# Patient Record
Sex: Male | Born: 1990 | Race: White | Hispanic: No | Marital: Single | Smoking: Current some day smoker
Health system: Southern US, Community
[De-identification: ages and names within clinical notes are randomized; demographics above are authoritative.]

---
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
## 2015-05-17 ENCOUNTER — Emergency Department (HOSPITAL_COMMUNITY): Payer: Self-pay

## 2015-05-17 ENCOUNTER — Encounter (HOSPITAL_COMMUNITY): Payer: Self-pay | Admitting: Emergency Medicine

## 2015-05-17 ENCOUNTER — Emergency Department (HOSPITAL_COMMUNITY)
Admission: EM | Admit: 2015-05-17 | Discharge: 2015-05-17 | Disposition: A | Discharge status: Home / Self Care | Payer: Self-pay | Attending: Emergency Medicine | Admitting: Emergency Medicine

## 2015-05-17 DIAGNOSIS — N200 Calculus of kidney: Secondary | ICD-10-CM | POA: Insufficient documentation

## 2015-05-17 DIAGNOSIS — Z720 Tobacco use: Secondary | ICD-10-CM | POA: Insufficient documentation

## 2015-05-17 LAB — URINALYSIS, ROUTINE W REFLEX MICROSCOPIC
Bilirubin Urine: NEGATIVE
Glucose, UA: NEGATIVE mg/dL
Ketones, ur: NEGATIVE mg/dL
Nitrite: NEGATIVE
Protein, ur: NEGATIVE mg/dL
Specific Gravity, Urine: 1.018 (ref 1.005–1.030)
Urobilinogen, UA: 0.2 mg/dL (ref 0.0–1.0)
pH: 6 (ref 5.0–8.0)

## 2015-05-17 LAB — CBC
HCT: 46.4 % (ref 39.0–52.0)
Hemoglobin: 15.8 g/dL (ref 13.0–17.0)
MCH: 31.4 pg (ref 26.0–34.0)
MCHC: 34.1 g/dL (ref 30.0–36.0)
MCV: 92.2 fL (ref 78.0–100.0)
Platelets: 228 K/uL (ref 150–400)
RBC: 5.03 MIL/uL (ref 4.22–5.81)
RDW: 13 % (ref 11.5–15.5)
WBC: 9.9 K/uL (ref 4.0–10.5)

## 2015-05-17 LAB — COMPREHENSIVE METABOLIC PANEL WITH GFR
ALT: 14 U/L — ABNORMAL LOW (ref 17–63)
AST: 16 U/L (ref 15–41)
Albumin: 4.3 g/dL (ref 3.5–5.0)
Alkaline Phosphatase: 45 U/L (ref 38–126)
Anion gap: 8 (ref 5–15)
BUN: 9 mg/dL (ref 6–20)
CO2: 25 mmol/L (ref 22–32)
Calcium: 9.4 mg/dL (ref 8.9–10.3)
Chloride: 107 mmol/L (ref 101–111)
Creatinine, Ser: 0.97 mg/dL (ref 0.61–1.24)
GFR calc Af Amer: >60 mL/min
GFR calc non Af Amer: >60 mL/min
Glucose, Bld: 105 mg/dL — ABNORMAL HIGH (ref 65–99)
Potassium: 4 mmol/L (ref 3.5–5.1)
Sodium: 140 mmol/L (ref 135–145)
Total Bilirubin: 0.6 mg/dL (ref 0.3–1.2)
Total Protein: 6.9 g/dL (ref 6.5–8.1)

## 2015-05-17 LAB — LIPASE, BLOOD: LIPASE: 22 U/L (ref 22–51)

## 2015-05-17 LAB — URINE MICROSCOPIC-ADD ON

## 2015-05-17 LAB — ETHANOL: Alcohol, Ethyl (B): <5 mg/dL

## 2015-05-17 MED ORDER — HYDROCODONE-ACETAMINOPHEN 5-325 MG PO TABS: 1.0000 | ORAL_TABLET | ORAL | Status: DC | PRN

## 2015-05-17 MED ORDER — TAMSULOSIN HCL 0.4 MG PO CAPS: 0.4000 mg | ORAL_CAPSULE | Freq: Every day | ORAL | Status: DC

## 2015-05-17 MED ORDER — IBUPROFEN 800 MG PO TABS: 800.0000 mg | ORAL_TABLET | Freq: Three times a day (TID) | ORAL | Status: DC

## 2015-05-17 NOTE — ED Notes (Signed)
Patient here with right flank pain radiating to right abdomen. States sudden onset @ 7. States several days of intermittent symptoms. States nausea and diaphoresis this AM when pain came on suddenly.

## 2015-05-17 NOTE — ED Provider Notes (Signed)
CSN: [id number]     Arrival date & time 05/17/15  0454 History   First MD Initiated Contact with Patient 05/17/15 0720     Chief Complaint  Patient presents with  . Flank Pain     (Consider location/radiation/quality/duration/timing/severity/associated sxs/prior Treatment) HPI Patient had an episode of pain approximately 9 days ago that was in the right flank. At that time it was aching in quality. He took some ibuprofen and it improved. He reports he was doing much better and then yesterday evening the pain suddenly got worse again. It woke him from sleep and was a hard aching pain in his right flank. He denies pain burning or urgency urination. He has not noticed blood in the urine. No fever no vomiting no diarrhea. At this time the pain has improved. History reviewed. No pertinent past medical history. History reviewed. No pertinent past surgical history. History reviewed. No pertinent family history. History  Substance Use Topics  . Smoking status: Current Some Day Smoker  . Smokeless tobacco: Not on file  . Alcohol Use: Yes    Review of Systems  10 Systems reviewed and are negative for acute change except as noted in the HPI.   Allergies  Review of patient's allergies indicates no known allergies.  Home Medications   Prior to Admission medications   Medication Sig Start Date End Date Taking? Authorizing Provider  acetaZOLAMIDE (DIAMOX) 250 MG tablet Take 250 mg by mouth at bedtime.   Yes Historical Provider, MD  HYDROcodone-acetaminophen (NORCO/VICODIN) 5-325 MG per tablet Take 1-2 tablets by mouth every 4 (four) hours as needed for moderate pain or severe pain. 05/17/15   Arby Barrette, MD  ibuprofen (ADVIL,MOTRIN) 800 MG tablet Take 1 tablet (800 mg total) by mouth 3 (three) times daily. 05/17/15   Arby Barrette, MD  tamsulosin (FLOMAX) 0.4 MG CAPS capsule Take 1 capsule (0.4 mg total) by mouth daily. 05/17/15   Arby Barrette, MD   BP 101/61 mmHg  Pulse 44  Temp(Src)  97.5 F (36.4 C) (Oral)  Resp 16  Ht 5\' 8"  (1.727 m)  Wt 170 lb (77.111 kg)  BMI 25.85 kg/m2  SpO2 97% Physical Exam  Constitutional: He is oriented to person, place, and time. He appears well-developed and well-nourished.  HENT:  Head: Normocephalic and atraumatic.  Eyes: EOM are normal. Pupils are equal, round, and reactive to light.  Neck: Neck supple.  Cardiovascular: Normal rate, regular rhythm, normal heart sounds and intact distal pulses.   Pulmonary/Chest: Effort normal and breath sounds normal.  Abdominal: Soft. Bowel sounds are normal. He exhibits no distension. There is no tenderness.  Musculoskeletal: Normal range of motion. He exhibits no edema.  Neurological: He is alert and oriented to person, place, and time. He has normal strength. Coordination normal. GCS eye subscore is 4. GCS verbal subscore is 5. GCS motor subscore is 6.  Skin: Skin is warm, dry and intact.  Psychiatric: He has a normal mood and affect.    ED Course  Procedures (including critical care time) Labs Review Labs Reviewed  COMPREHENSIVE METABOLIC PANEL - Abnormal; Notable for the following:    Glucose, Bld 105 (*)    ALT 14 (*)    All other components within normal limits  URINALYSIS, ROUTINE W REFLEX MICROSCOPIC (NOT AT [facility]) - Abnormal; Notable for the following:    Color, Urine AMBER (*)    APPearance CLOUDY (*)    Hgb urine dipstick LARGE (*)    Leukocytes, UA TRACE (*)  All other components within normal limits  URINE CULTURE  LIPASE, BLOOD  CBC  URINE MICROSCOPIC-ADD ON  ETHANOL    Imaging Review Ct Renal Stone Study  05/17/2015   CLINICAL DATA:  RIGHT flank pain.  Urolithiasis.  Initial encounter.  EXAM: CT ABDOMEN AND PELVIS WITHOUT CONTRAST  TECHNIQUE: Multidetector CT imaging of the abdomen and pelvis was performed following the standard protocol without IV contrast.  COMPARISON:  None.  FINDINGS: Musculoskeletal:  No aggressive osseous lesions.  Lung Bases: Clear.  Liver:  Unenhanced CT was performed per clinician order. Lack of IV contrast limits sensitivity and specificity, especially for evaluation of abdominal/pelvic solid viscera. Normal.  Spleen:  Normal.  Gallbladder:  Contracted.  Common bile duct:  Normal.  Pancreas:  Normal.  Adrenal glands:  Normal bilaterally.  Kidneys: Nonobstructing LEFT inferior pole renal collecting system calculi are present. The largest stone measures 6 mm long axis. Nonobstructing RIGHT inferior pole collecting system punctate calculi.  The LEFT ureter is normal. The RIGHT ureter demonstrates a stone at the UPJ that measures 4 mm x 2 mm on axial imaging (image 36 series 2). The distal RIGHT ureter appears normal.  Stomach:  Normal.  Collapsed.  Small bowel:  Normal.  Colon:   Normal.  Pelvic Genitourinary:  Normal.  Peritoneum: No free fluid or free air.  Vascular/lymphatic: Grossly normal.  Body Wall: Normal.  IMPRESSION: 1. 4 mm x 2 mm RIGHT UPJ calculus.  No hydronephrosis. 2. Residual nonobstructing renal collecting system calculi.   Electronically Signed   By: Andreas Newport M.D.   On: 05/17/2015 08:08     EKG Interpretation None     Consult: Patient's case reviewed with Dr. Jacquelyne Balint of urology. He will see him in follow-up in the office. He recommends for Flomax urine strainer and pain medication. No antibiotics at this time based on UA results. MDM   Final diagnoses:  Kidney stone   Patient presents as outlined above with episodic right flank pain. Kidney stone is confirmed on CT. At this time the patient's pain has resolved. Follow-up plan is in place and instructions include return precautions.    Arby Barrette, MD 05/17/15 [phone]

## 2015-05-17 NOTE — Discharge Instructions (Signed)

## 2015-05-18 LAB — URINE CULTURE: Culture: NO GROWTH

## 2016-08-09 IMAGING — CT CT RENAL STONE PROTOCOL
2 of 4 series · 16 of 46 positions shown, 18 images · non-contrast
Comparison: None.

CLINICAL DATA: RIGHT flank pain.  Urolithiasis.  Initial encounter.

EXAM:
CT ABDOMEN AND PELVIS WITHOUT CONTRAST
TECHNIQUE: Multidetector CT imaging of the abdomen and pelvis was performed
following the standard protocol without IV contrast.

[Series 2: stone study 5.0 i30f 1 · axial · 0.83mm/px · z∈[+742,+1162]mm · 13 of 92 slices shown, 15 images]
[im 4/92  soft-tissue]
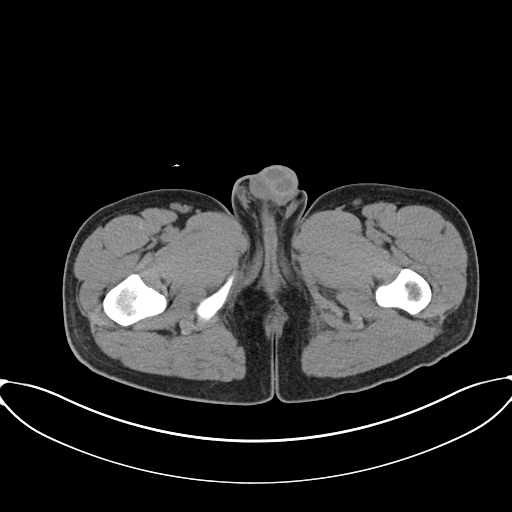
[im 4/92  bone]
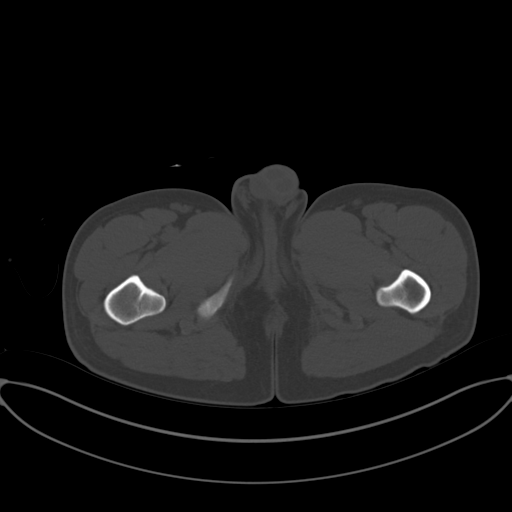
[im 11/92  soft-tissue]
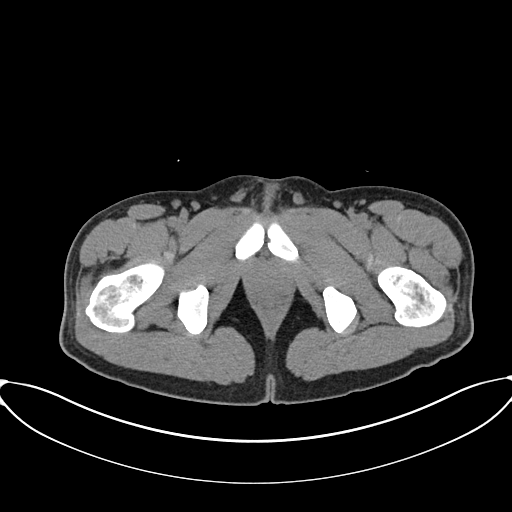
[im 19/92  soft-tissue]
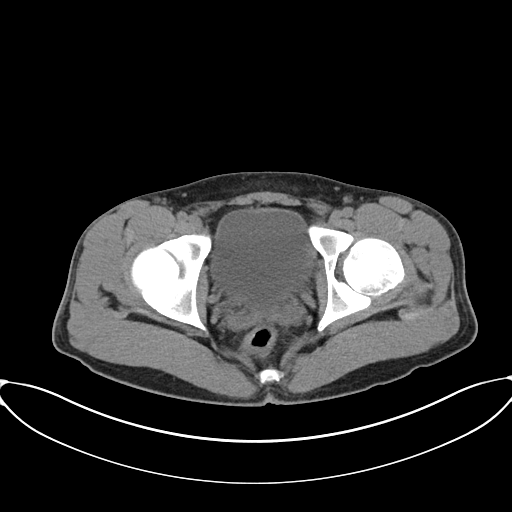
[im 26/92  soft-tissue]
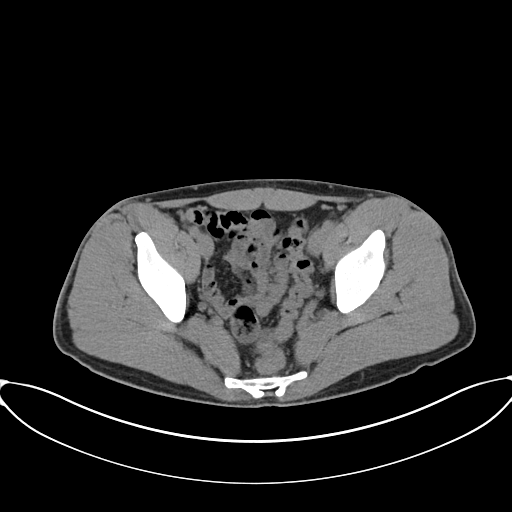
[im 33/92  soft-tissue]
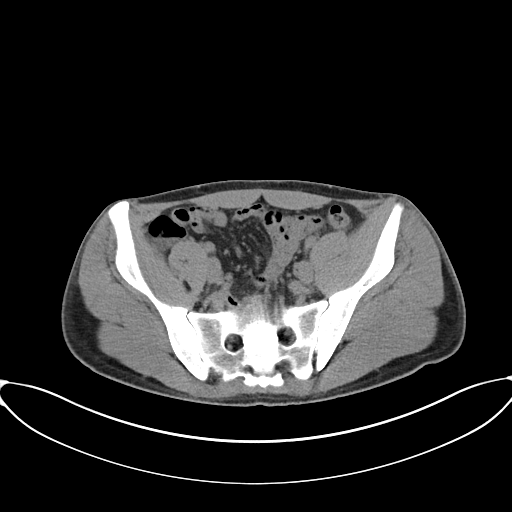
[im 41/92  soft-tissue]
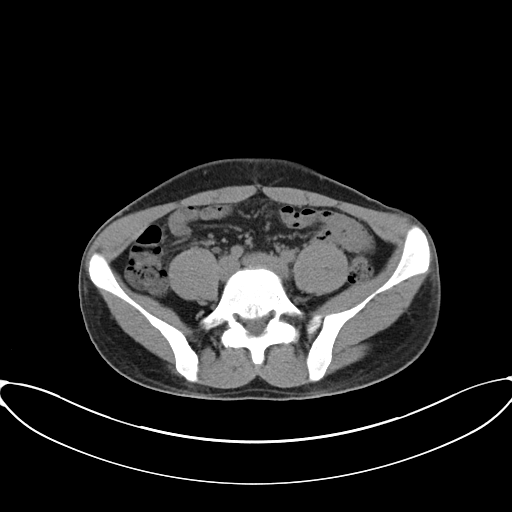
[im 48/92  soft-tissue]
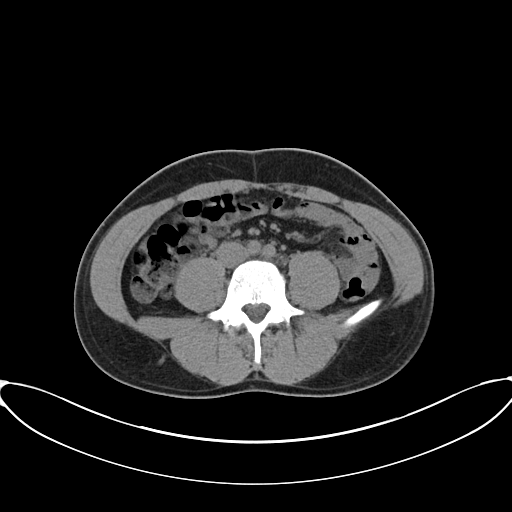
[im 51/92  soft-tissue]
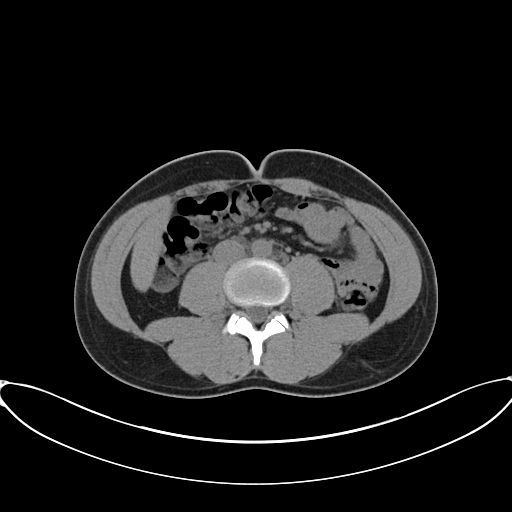
[im 59/92  soft-tissue]
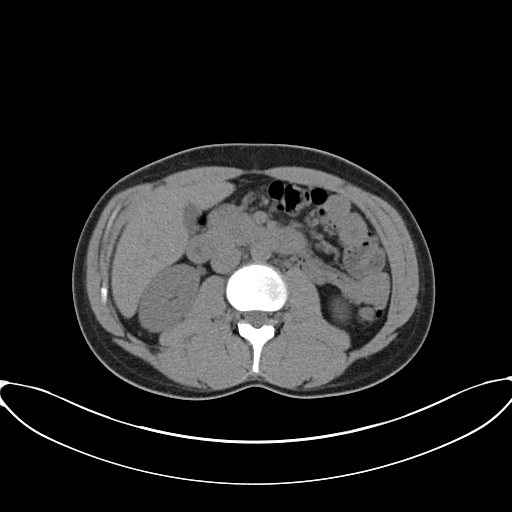
[im 59/92  bone]
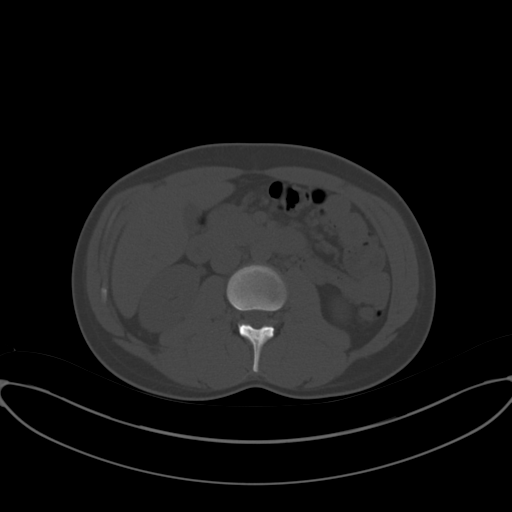
[im 66/92  soft-tissue]
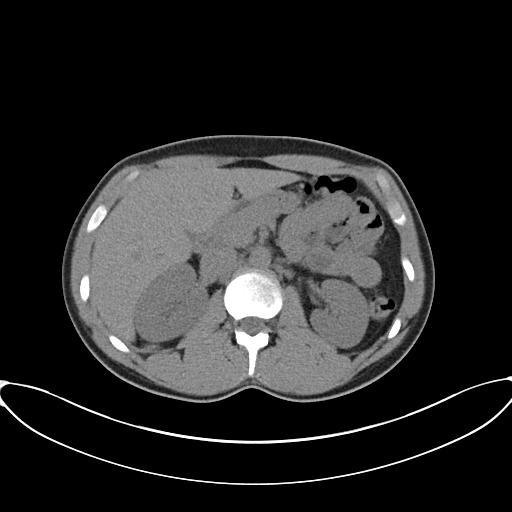
[im 73/92  soft-tissue]
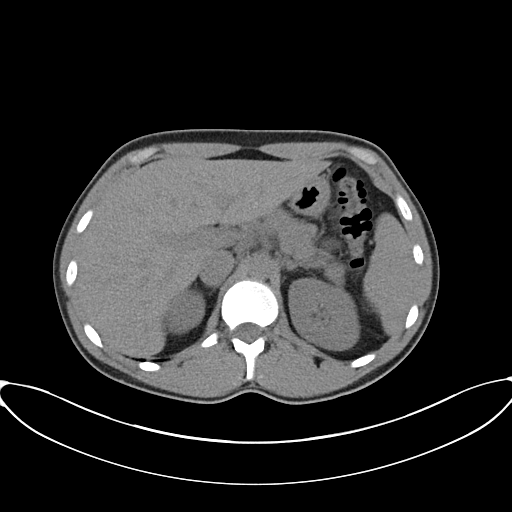
[im 81/92  soft-tissue]
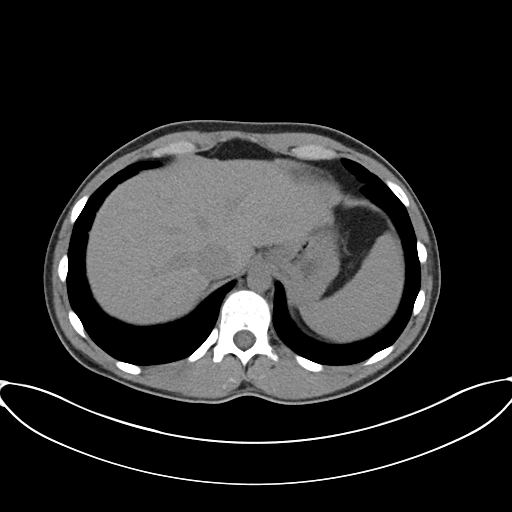
[im 88/92  soft-tissue]
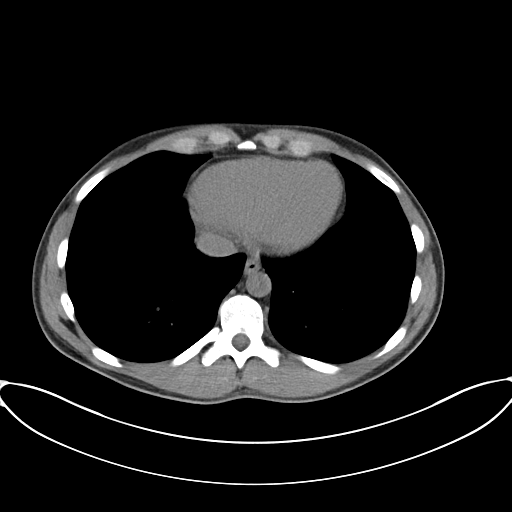

[Series 5: coronal soft tissue · coronal · 0.71mm/px · 3 of 81 slices shown]
[im 27/81  soft-tissue]
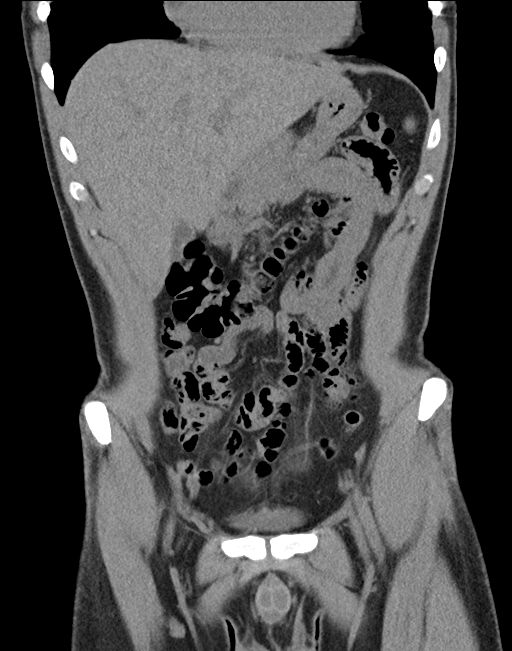
[im 36/81  soft-tissue]
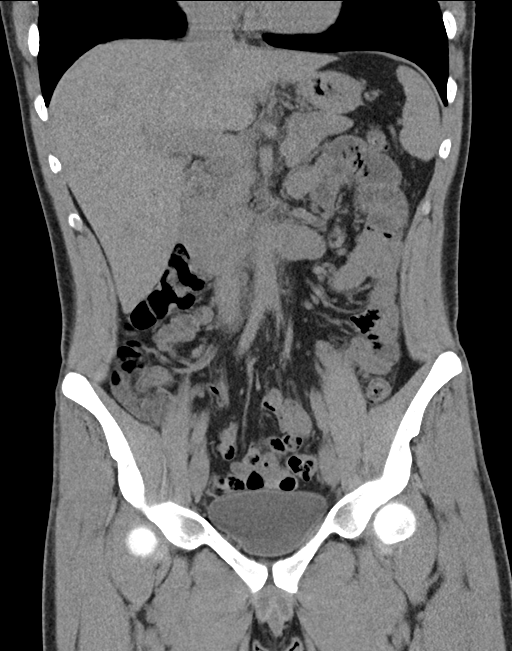
[im 45/81  soft-tissue]
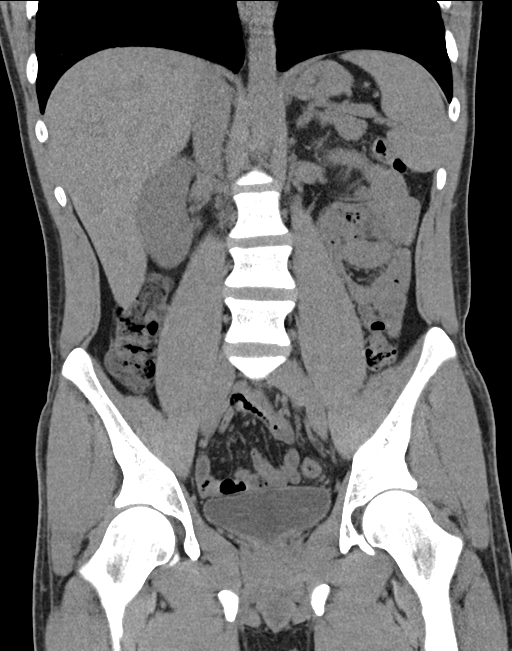

[16 of 46 positions shown; findings below may reference images not displayed]

FINDINGS: Musculoskeletal:  No aggressive osseous lesions.

Lung Bases: Clear.

Liver: Unenhanced CT was performed per clinician order. Lack of IV
contrast limits sensitivity and specificity, especially for
evaluation of abdominal/pelvic solid viscera. Normal.

Spleen:  Normal.

Gallbladder:  Contracted.

Common bile duct:  Normal.

Pancreas:  Normal.

Adrenal glands:  Normal bilaterally.

Kidneys: Nonobstructing LEFT inferior pole renal collecting system
calculi are present. The largest stone measures 6 mm long axis.
Nonobstructing RIGHT inferior pole collecting system punctate
calculi.

The LEFT ureter is normal. The RIGHT ureter demonstrates a stone at
the UPJ that measures 4 mm x 2 mm on axial imaging (image 36 series
2). The distal RIGHT ureter appears normal.

Stomach:  Normal.  Collapsed.

Small bowel:  Normal.

Colon:   Normal.

Pelvic Genitourinary:  Normal.

Peritoneum: No free fluid or free air.

Vascular/lymphatic: Grossly normal.

Body Wall: Normal.
IMPRESSION: 1. 4 mm x 2 mm RIGHT UPJ calculus.  No hydronephrosis.
2. Residual nonobstructing renal collecting system calculi.

## 2017-03-25 ENCOUNTER — Emergency Department (HOSPITAL_COMMUNITY)
Admission: EM | Admit: 2017-03-25 | Discharge: 2017-03-25 | Disposition: A | Discharge status: Home / Self Care | Payer: Medicaid Other | Attending: Emergency Medicine | Admitting: Emergency Medicine

## 2017-03-25 ENCOUNTER — Encounter (HOSPITAL_COMMUNITY): Payer: Self-pay | Admitting: Emergency Medicine

## 2017-03-25 DIAGNOSIS — F17210 Nicotine dependence, cigarettes, uncomplicated: Secondary | ICD-10-CM | POA: Insufficient documentation

## 2017-03-25 DIAGNOSIS — R197 Diarrhea, unspecified: Secondary | ICD-10-CM | POA: Diagnosis not present

## 2017-03-25 DIAGNOSIS — R112 Nausea with vomiting, unspecified: Secondary | ICD-10-CM

## 2017-03-25 LAB — COMPREHENSIVE METABOLIC PANEL
ALT: 26 U/L (ref 17–63)
AST: 23 U/L (ref 15–41)
Albumin: 4.3 g/dL (ref 3.5–5.0)
Alkaline Phosphatase: 48 U/L (ref 38–126)
Anion gap: 7 (ref 5–15)
BUN: 13 mg/dL (ref 6–20)
CO2: 21 mmol/L — ABNORMAL LOW (ref 22–32)
Calcium: 8.7 mg/dL — ABNORMAL LOW (ref 8.9–10.3)
Chloride: 109 mmol/L (ref 101–111)
Creatinine, Ser: 0.86 mg/dL (ref 0.61–1.24)
GFR calc Af Amer: >60 mL/min (ref >60)
GFR calc non Af Amer: >60 mL/min (ref >60)
Glucose, Bld: 94 mg/dL (ref 65–99)
Potassium: 3.5 mmol/L (ref 3.5–5.1)
Sodium: 137 mmol/L (ref 135–145)
Total Bilirubin: 0.8 mg/dL (ref 0.3–1.2)
Total Protein: 6.7 g/dL (ref 6.5–8.1)

## 2017-03-25 LAB — URINALYSIS, ROUTINE W REFLEX MICROSCOPIC
Bilirubin Urine: NEGATIVE
Glucose, UA: NEGATIVE mg/dL
Hgb urine dipstick: NEGATIVE
Ketones, ur: NEGATIVE mg/dL
Leukocytes, UA: NEGATIVE
Nitrite: NEGATIVE
Protein, ur: NEGATIVE mg/dL
Specific Gravity, Urine: 1.034 — ABNORMAL HIGH (ref 1.005–1.030)
pH: 5 (ref 5.0–8.0)

## 2017-03-25 LAB — LIPASE, BLOOD: Lipase: 21 U/L (ref 11–51)

## 2017-03-25 LAB — CBC
HCT: 45.6 % (ref 39.0–52.0)
Hemoglobin: 16 g/dL (ref 13.0–17.0)
MCH: 31.6 pg (ref 26.0–34.0)
MCHC: 35.1 g/dL (ref 30.0–36.0)
MCV: 89.9 fL (ref 78.0–100.0)
Platelets: 174 10*3/uL (ref 150–400)
RBC: 5.07 MIL/uL (ref 4.22–5.81)
RDW: 13.8 % (ref 11.5–15.5)
WBC: 5.2 10*3/uL (ref 4.0–10.5)

## 2017-03-25 MED ORDER — ONDANSETRON HCL 4 MG PO TABS
4.0000 mg | ORAL_TABLET | Freq: Four times a day (QID) | ORAL | 0 refills | Status: DC
Start: 2017-03-25 — End: 2019-09-23

## 2017-03-25 MED ORDER — SODIUM CHLORIDE 0.9 % IV BOLUS (SEPSIS)
1000.0000 mL | Freq: Once | INTRAVENOUS | Status: AC
  Administered 2017-03-25: 1000 mL via INTRAVENOUS

## 2017-03-25 MED ORDER — ONDANSETRON HCL 4 MG/2ML IJ SOLN
4.0000 mg | Freq: Once | INTRAMUSCULAR | Status: AC
  Administered 2017-03-25: 4 mg via INTRAVENOUS
  Filled 2017-03-25: qty 2

## 2017-03-25 MED ORDER — FAMOTIDINE 20 MG PO TABS
20.0000 mg | ORAL_TABLET | Freq: Once | ORAL | Status: AC
  Administered 2017-03-25: 20 mg via ORAL
  Filled 2017-03-25: qty 1

## 2017-03-25 MED ORDER — LOPERAMIDE HCL 2 MG PO CAPS
4.0000 mg | ORAL_CAPSULE | Freq: Once | ORAL | Status: AC
  Administered 2017-03-25: 4 mg via ORAL
  Filled 2017-03-25: qty 2

## 2017-03-25 MED ORDER — GI COCKTAIL ~~LOC~~
30.0000 mL | Freq: Once | ORAL | Status: AC
  Administered 2017-03-25: 30 mL via ORAL
  Filled 2017-03-25: qty 30

## 2017-03-25 NOTE — ED Notes (Signed)
Pt from home with c/o N/V/D starting Fri morning at 3 am.  Pt reports he gets diaphoretic and hot at the same time and has been unable to tolerate PO water intake.  Denies any one else being sick or known fevers.  Pt reports his vision has begun to change and dizziness starting today.  NAD, A&O.

## 2017-03-25 NOTE — ED Provider Notes (Signed)
[facility]-EMERGENCY DEPT Provider Note   CSN: [id number] Arrival date & time: 03/25/17  1154  By signing my name below, I, Phillips Climes, attest that this documentation has been prepared under the direction and in the presence of Raeford Razor, MD . Electronically Signed: Phillips Climes, Scribe. 03/25/2017. 1:47 PM.  History   Chief Complaint Chief Complaint  Patient presents with  . Emesis  . Diarrhea   HPI Comments Eddie Brown is a 25 y.o. male with a PMHx significant for panuveitis, on methotrexate, who presents to the Emergency Department with complaints of nausea, vomiting and diarrhea x4 days.  Low PO intake and weakness secondary to this.  Pt was doing yard work x4 days ago and overexerted himself in the heat.  He felt chills later that night, and woke up the next day with a sudden onset of nausea and diarrhea, which has been constant since. Pain is currently rated a 2/10 in severity.  No hx of ulcers, reflux.  No reported sick contact.  No joint pain or muscle aches.  Pt uses motrin x1 a week for headache and states occaional alcohol use.  Pt denies experiencing any other acute sx, including fevers or abdominal pain.  He has not attempted any OTC symptomatic management before presenting today for evaluation.  The history is provided by the patient and medical records. No language interpreter was used.   History reviewed. No pertinent past medical history.  There are no active problems to display for this patient.  History reviewed. No pertinent surgical history.  Home Medications    Prior to Admission medications   Medication Sig Start Date End Date Taking? Authorizing Provider  acetaZOLAMIDE (DIAMOX) 250 MG tablet Take 250 mg by mouth at bedtime.    [provider]  HYDROcodone-acetaminophen (NORCO/VICODIN) 5-325 MG per tablet Take 1-2 tablets by mouth every 4 (four) hours as needed for moderate pain or severe pain. 05/17/15   Arby Barrette, MD  ibuprofen  (ADVIL,MOTRIN) 800 MG tablet Take 1 tablet (800 mg total) by mouth 3 (three) times daily. 05/17/15   Arby Barrette, MD  tamsulosin (FLOMAX) 0.4 MG CAPS capsule Take 1 capsule (0.4 mg total) by mouth daily. 05/17/15   Arby Barrette, MD    Family History History reviewed. No pertinent family history.  Social History Social History  Substance Use Topics  . Smoking status: Current Some Day Smoker    Types: Cigarettes  . Smokeless tobacco: Never Used  . Alcohol use Yes     Comment: occasionally   Allergies   Patient has no known allergies.  Review of Systems Review of Systems  Constitutional: Positive for chills (Resolved). Negative for fever.  Gastrointestinal: Positive for diarrhea, nausea and vomiting. Negative for abdominal pain.  All other systems reviewed and are negative.  Physical Exam Updated Vital Signs BP 132/89 (BP Location: Right Arm)   Pulse 76   Temp 97.8 F (36.6 C) (Oral)   Resp 18   Ht 5\' 8"  (1.727 m)   Wt 162 lb (73.5 kg)   SpO2 100%   BMI 24.63 kg/m   Physical Exam  Constitutional: He is oriented to person, place, and time. He appears well-developed and well-nourished.  HENT:  Head: Normocephalic and atraumatic.  Eyes: EOM are normal.  Neck: Normal range of motion.  Cardiovascular: Normal rate, regular rhythm, normal heart sounds and intact distal pulses.   Pulmonary/Chest: Effort normal and breath sounds normal. No respiratory distress.  Abdominal: Soft. He exhibits no distension.  There is no tenderness.  Musculoskeletal: Normal range of motion.  Neurological: He is alert and oriented to person, place, and time.  Skin: Skin is warm and dry.  Psychiatric: He has a normal mood and affect. Judgment normal.  Nursing note and vitals reviewed.  ED Treatments / Results  DIAGNOSTIC STUDIES: Oxygen Saturation is 100% on room air, normal by my interpretation.    COORDINATION OF CARE: 1:47 PM Discussed treatment plan with pt at bedside and pt agreed to  plan.  Labs (all labs ordered are listed, but only abnormal results are displayed) Labs Reviewed  COMPREHENSIVE METABOLIC PANEL - Abnormal; Notable for the following:       Result Value   CO2 21 (*)    Calcium 8.7 (*)    All other components within normal limits  URINALYSIS, ROUTINE W REFLEX MICROSCOPIC - Abnormal; Notable for the following:    Color, Urine AMBER (*)    Specific Gravity, Urine 1.034 (*)    All other components within normal limits  LIPASE, BLOOD  CBC    EKG  EKG Interpretation None       Radiology No results found.  Procedures Procedures (including critical care time)  Medications Ordered in ED Medications - No data to display   Initial Impression / Assessment and Plan / ED Course  I have reviewed the triage vital signs and the nursing notes.  Pertinent labs & imaging results that were available during my care of the patient were reviewed by me and considered in my medical decision making (see chart for details).     25yM with n/v/d. Abdominal exam benign. HD stable. Nontoxic. Appearance. Afebrile. It has been determined that no acute conditions requiring further emergency intervention are present at this time. The patient has been advised of the diagnosis and plan. I reviewed any labs and imaging including any potential incidental findings. We have discussed signs and symptoms that warrant return to the ED and they are listed in the discharge instructions.   I personally preformed the services scribed in my presence. The recorded information has been reviewed is accurate. Raeford RazorStephen Donevan Biller, MD.  Final Clinical Impressions(s) / ED Diagnoses   Final diagnoses:  Nausea vomiting and diarrhea    New Prescriptions New Prescriptions   No medications on file     Raeford RazorKohut, Syreeta Figler, MD 04/01/17 1158

## 2017-03-25 NOTE — ED Notes (Signed)
Pt stable, ambulatory, states understanding of discharge instructions 

## 2017-05-23 ENCOUNTER — Emergency Department (HOSPITAL_COMMUNITY)
Admission: EM | Admit: 2017-05-23 | Discharge: 2017-05-24 | Disposition: A | Discharge status: Home / Self Care | Payer: Medicaid Other | Attending: Emergency Medicine | Admitting: Emergency Medicine

## 2017-05-23 ENCOUNTER — Encounter (HOSPITAL_COMMUNITY): Payer: Self-pay | Admitting: Emergency Medicine

## 2017-05-23 DIAGNOSIS — F17210 Nicotine dependence, cigarettes, uncomplicated: Secondary | ICD-10-CM | POA: Insufficient documentation

## 2017-05-23 DIAGNOSIS — Z79899 Other long term (current) drug therapy: Secondary | ICD-10-CM | POA: Insufficient documentation

## 2017-05-23 DIAGNOSIS — L02512 Cutaneous abscess of left hand: Secondary | ICD-10-CM

## 2017-05-23 DIAGNOSIS — M79642 Pain in left hand: Secondary | ICD-10-CM | POA: Diagnosis present

## 2017-05-23 MED ORDER — CEPHALEXIN 250 MG PO CAPS
500.0000 mg | ORAL_CAPSULE | Freq: Once | ORAL | Status: AC
  Administered 2017-05-24: 500 mg via ORAL
  Filled 2017-05-23: qty 2

## 2017-05-23 NOTE — ED Triage Notes (Signed)
Pt states he had a insect bite yesterday on his finger, it was a small bump yesterday, today is getting more swollen and painful, denies any fever or chills. No injuries.

## 2017-05-23 NOTE — ED Provider Notes (Signed)
[facility]-EMERG[facility]Y DEPT Provider Note   CSN: [id number] Arrival date & time: 05/23/17  2022  By signing my name below, I, Eddie Brown, attest that this documentation has been prepared under the direction and in the presence of SPX CorporationMichael Maczis, PA-C.  Electronically Signed: Rosario AdieWilliam Andrew Brown, ED Scribe. 05/23/17. 11:52 PM.  History   Chief Complaint Chief Complaint  Patient presents with  . Hand Pain    left hand   The history is provided by the patient. No language interpreter was used.    HPI Comments: Eddie Brown is a 26 y.o. male with no pertinent PMHx, who presents to the Emergency Department complaining of a moderate, gradually worsening area of pain and swelling to the left thumb onset two days ago. Pt reports that yesterday morning he woke up with a small area of swelling which he attributed to a bug bite. He did not visualize any insects, ticks, or arachnids on him at that time of onset. The area has continued to swelling and worsen with pain increasing over the area which he describes as throbbing. Pt states pain is exacerbated with palpation and direct pressure. No noted treatments for his symptoms were tried prior to coming into the ED. Denies fever, chills, drainage from the area, or any other associated symptoms.   History reviewed. No pertinent past medical history.  There are no active problems to display for this patient.  History reviewed. No pertinent surgical history.  Home Medications    Prior to Admission medications   Medication Sig Start Date End Date Taking? Authorizing Provider  acetaZOLAMIDE (DIAMOX) 250 MG tablet Take 250 mg by mouth at bedtime.    [provider]  cephALEXin (KEFLEX) 500 MG capsule Take 1 capsule (500 mg total) by mouth 2 (two) times daily. 05/24/17   Maczis, Elmer SowMichael M, PA-C  HYDROcodone-acetaminophen (NORCO/VICODIN) 5-325 MG per tablet Take 1-2 tablets by mouth every 4 (four) hours as needed for moderate pain or  severe pain. 05/17/15   Arby BarrettePfeiffer, Marcy, MD  ibuprofen (ADVIL,MOTRIN) 800 MG tablet Take 1 tablet (800 mg total) by mouth 3 (three) times daily. 05/17/15   Arby BarrettePfeiffer, Marcy, MD  ondansetron (ZOFRAN) 4 MG tablet Take 1 tablet (4 mg total) by mouth every 6 (six) hours. 03/25/17   Raeford RazorKohut, Stephen, MD  tamsulosin (FLOMAX) 0.4 MG CAPS capsule Take 1 capsule (0.4 mg total) by mouth daily. 05/17/15   Arby BarrettePfeiffer, Marcy, MD   Family History History reviewed. No pertinent family history.  Social History Social History  Substance Use Topics  . Smoking status: Current Some Day Smoker    Packs/day: 0.50    Types: Cigarettes  . Smokeless tobacco: Never Used  . Alcohol use Yes     Comment: occasionally   Allergies   Patient has no known allergies.  Review of Systems Review of Systems  Constitutional: Negative for chills and fever.  Skin: Positive for wound ( +area of pain and swelling to the left thumb).   Physical Exam Updated Vital Signs BP 130/89 (BP Location: Left Arm)   Pulse (!) 46   Temp 97.7 F (36.5 C) (Oral)   Resp 16   Ht 5\' 8"  (1.727 m)   Wt 74.8 kg (165 lb)   SpO2 100%   BMI 25.09 kg/m   Physical Exam  Constitutional: He appears well-developed and well-nourished. No distress.  HENT:  Head: Normocephalic and atraumatic.  Right Ear: External ear normal.  Left Ear: External ear normal.  Eyes: Conjunctivae are normal.  Right eye exhibits no discharge. Left eye exhibits no discharge. No scleral icterus.  Neck: Normal range of motion.  Cardiovascular: Normal rate.   Pulmonary/Chest: Effort normal. No respiratory distress.  Abdominal: He exhibits no distension.  Musculoskeletal: Normal range of motion.  Just distal to the first MCP of the left hand he has a 1cm area of erythema and fluctuance. Possible noted area where bite may have occurred. He has full active ROM of the thumb. Good grip strength Radial pulse is 2+ Good capillary refill. Sensory is intact to light touch.     Neurological: He is alert.  Skin: No pallor.  Psychiatric: He has a normal mood and affect. His behavior is normal.  Nursing note and vitals reviewed.  ED Treatments / Results  DIAGNOSTIC STUDIES: Oxygen Saturation is 100% on RA, normal by my interpretation.   COORDINATION OF CARE: 11:52 PM-Discussed next steps with pt. Pt verbalized understanding and is agreeable with the plan.   Labs (all labs ordered are listed, but only abnormal results are displayed) Labs Reviewed - No data to display  EKG  EKG Interpretation None      Radiology No results found.  Procedures Procedures   Medications Ordered in ED Medications  cephALEXin (KEFLEX) capsule 500 mg (500 mg Oral Given 05/24/17 0013)    Initial Impression / Assessment and Plan / ED Course  I have reviewed the triage vital signs and the nursing notes.  Pertinent labs & imaging results that were available during my care of the patient were reviewed by me and considered in my medical decision making (see chart for details).     Eddie Brown is a 26 y.o. male who presents to ED for abscess not requiring incision and drainage. Mild evidence of surrounding erythema to suggest cellulitis. Patient was prescribed Keflex. First dose given in ED. Home care instructions discussed. Return to ER if concern for spread of infection, increasing pain, fevers or other concerns.  Follow up with PCP advised. All questions answered. Patient appears safe for discharge.   Final Clinical Impressions(s) / ED Diagnoses   Final diagnoses:  Abscess of left hand   New Prescriptions Discharge Medication List as of 05/24/2017 12:23 AM    START taking these medications   Details  cephALEXin (KEFLEX) 500 MG capsule Take 1 capsule (500 mg total) by mouth 2 (two) times daily., Starting Fri 05/24/2017, Print       I personally performed the services described in this documentation, which was scribed in my presence. The recorded information  has been reviewed and is accurate.        Princella PellegriniMaczis, Michael M, PA-C 05/24/17 0128    Jacalyn LefevreHaviland, Julie, MD 05/27/17 [phone]

## 2017-05-24 MED ORDER — CEPHALEXIN 500 MG PO CAPS: 500.0000 mg | ORAL_CAPSULE | Freq: Two times a day (BID) | ORAL | 0 refills | Status: DC

## 2017-05-24 NOTE — Discharge Instructions (Signed)
Please take all of your antibiotics until finished!   You may develop abdominal discomfort or diarrhea from the antibiotic.  You may help offset this with probiotics which you can buy or get in yogurt. Do not eat  or take the probiotics until 2 hours after your antibiotic.  ° °Follow up with your PCP this week for recheck. If you develop worsening or new concerning symptoms you can return to the emergency department for re-evaluation. See below for additional return precautions.  ° °Cellulitis is a skin infection. The infected area is usually red and sore. This condition occurs most often in the arms and lower legs. It is very important to get treated for this condition. °Follow these instructions at home: °Take over-the-counter and prescription medicines only as told by your doctor. °If you were prescribed an antibiotic medicine, take it as told by your doctor. Do not stop taking the antibiotic even if you start to feel better. °Drink enough fluid to keep your pee (urine) clear or pale yellow. °Do not touch or rub the infected area. °Raise (elevate) the infected area above the level of your heart while you are sitting or lying down. °Place warm or cold wet cloths (warm or cold compresses) on the infected area. Do this as told by your doctor. °Keep all follow-up visits as told by your doctor. This is important. These visits let your doctor make sure your infection is not getting worse. °Contact a doctor if: °You have a fever. °Your symptoms do not get better after 1-2 days of treatment. °Your bone or joint under the infected area starts to hurt after the skin has healed. °Your infection comes back. This can happen in the same area or another area. °You have a swollen bump in the infected area. °You have new symptoms. °You feel ill and also have muscle aches and pains. °Get help right away if: °Your symptoms get worse. °You feel very sleepy. °You throw up (vomit) or have watery poop (diarrhea) for a long time. °There  are red streaks coming from the infected area. °Your red area gets larger. °Your red area turns darker. ° °

## 2018-07-01 DIAGNOSIS — H3581 Retinal edema: Secondary | ICD-10-CM | POA: Diagnosis not present

## 2018-07-01 DIAGNOSIS — H4311 Vitreous hemorrhage, right eye: Secondary | ICD-10-CM | POA: Diagnosis not present

## 2018-07-01 DIAGNOSIS — H30033 Focal chorioretinal inflammation, peripheral, bilateral: Secondary | ICD-10-CM | POA: Diagnosis not present

## 2018-07-01 DIAGNOSIS — Z79899 Other long term (current) drug therapy: Secondary | ICD-10-CM | POA: Diagnosis not present

## 2018-09-12 DIAGNOSIS — Z79899 Other long term (current) drug therapy: Secondary | ICD-10-CM | POA: Diagnosis not present

## 2018-09-12 DIAGNOSIS — H30033 Focal chorioretinal inflammation, peripheral, bilateral: Secondary | ICD-10-CM | POA: Diagnosis not present

## 2018-12-29 ENCOUNTER — Encounter (HOSPITAL_COMMUNITY): Payer: Self-pay

## 2018-12-29 ENCOUNTER — Other Ambulatory Visit: Payer: Self-pay

## 2018-12-29 ENCOUNTER — Ambulatory Visit (HOSPITAL_COMMUNITY)
Admission: EM | Admit: 2018-12-29 | Discharge: 2018-12-29 | Disposition: A | Discharge status: Home / Self Care | Payer: Medicare Other | Attending: Family Medicine | Admitting: Family Medicine

## 2018-12-29 DIAGNOSIS — Z720 Tobacco use: Secondary | ICD-10-CM | POA: Diagnosis not present

## 2018-12-29 DIAGNOSIS — J029 Acute pharyngitis, unspecified: Secondary | ICD-10-CM | POA: Diagnosis not present

## 2018-12-29 DIAGNOSIS — H9201 Otalgia, right ear: Secondary | ICD-10-CM

## 2018-12-29 MED ORDER — IBUPROFEN 800 MG PO TABS: 800.0000 mg | ORAL_TABLET | Freq: Three times a day (TID) | ORAL | 0 refills | Status: DC | PRN

## 2018-12-29 MED ORDER — AMOXICILLIN 875 MG PO TABS: 875.0000 mg | ORAL_TABLET | Freq: Two times a day (BID) | ORAL | 0 refills | Status: AC

## 2018-12-29 NOTE — ED Triage Notes (Signed)
Pt cc ear discomfort (right ) this started today. pt states he has had a sore throat, runny nose x 2 weeks.

## 2018-12-29 NOTE — Discharge Instructions (Signed)
You may use over the counter ibuprofen or acetaminophen as needed.  ° °

## 2018-12-29 NOTE — ED Provider Notes (Signed)
[facility]ENTER   [id number] 12/29/18 Arrival Time: 1846  ASSESSMENT & PLAN:  1. Sore throat   2. Otalgia of right ear    Question developing OM.  Meds ordered this encounter  Medications  . ibuprofen (ADVIL,MOTRIN) 800 MG tablet    Sig: Take 1 tablet (800 mg total) by mouth every 8 (eight) hours as needed.    Dispense:  21 tablet    Refill:  0  . amoxicillin (AMOXIL) 875 MG tablet    Sig: Take 1 tablet (875 mg total) by mouth 2 (two) times daily for 10 days.    Dispense:  20 tablet    Refill:  0   OTC analgesics and throat care as needed  Instructed to finish full 10 day course of antibiotics. Will follow up if not showing significant improvement over the next 24-48 hours.    Discharge Instructions     You may use over the counter ibuprofen or acetaminophen as needed.     Reviewed expectations re: course of current medical issues. Questions answered. Outlined signs and symptoms indicating need for more acute intervention. Patient verbalized understanding. After Visit Summary given.   SUBJECTIVE:  Eddie Brown is a 28 y.o. adult who reports a sore throat. Describes as pain with swallowing. Onset abrupt beginning 2 weeks ago. Cold symptoms + post-nasal drainage for two weeks. Normal PO intake but reports discomfort with swallowing. Fever reported: unsure. No neck pain or swelling. No associated n/v/abdominal symptoms. Sick contacts: none known.  Also reports R otalgia without drainage or bleeding starting yesterday evening. Normal hearing. More painful today.  OTC treatment: none reported.  ROS: As per HPI.   OBJECTIVE:  Vitals:   12/29/18 1909 12/29/18 1910  BP: 140/85   Pulse: 74   Resp: 18   Temp: 98.5 F (36.9 C)   TempSrc: Tympanic   SpO2: 100%   Weight:  76.7 kg     General appearance: alert; no distress HEENT: throat with mild erythema; uvula midline: yes; R TM with erythema and slight bulging Neck: supple with FROM; no  lymphadenopathy CV: RRR Lungs: clear to auscultation bilaterally Abd: soft; non-tender Skin: reveals no rash; warm and dry Psychological: alert and cooperative; normal mood and affect  No Known Allergies  History reviewed. No pertinent past medical history. Social History   Socioeconomic History  . Marital status: Single    Spouse name: Not on file  . Number of children: Not on file  . Years of education: Not on file  . Highest education level: Not on file  Occupational History  . Not on file  Social Needs  . Financial resource strain: Not on file  . Food insecurity:    Worry: Not on file    Inability: Not on file  . Transportation needs:    Medical: Not on file    Non-medical: Not on file  Tobacco Use  . Smoking status: Current Some Day Smoker    Packs/day: 0.50    Types: Cigarettes  . Smokeless tobacco: Never Used  Substance and Sexual Activity  . Alcohol use: Yes    Comment: occasionally  . Drug use: Yes    Types: Marijuana    Comment: occasionally  . Sexual activity: Not on file  Lifestyle  . Physical activity:    Days per week: Not on file    Minutes per session: Not on file  . Stress: Not on file  Relationships  . Social connections:    Talks on  phone: Not on file    Gets together: Not on file    Attends religious service: Not on file    Active member of club or organization: Not on file    Attends meetings of clubs or organizations: Not on file    Relationship status: Not on file  . Intimate partner violence:    Fear of current or ex partner: Not on file    Emotionally abused: Not on file    Physically abused: Not on file    Forced sexual activity: Not on file  Other Topics Concern  . Not on file  Social History Narrative  . Not on file   Family History  Problem Relation Age of Onset  . Cancer Father           Mardella Layman, MD 12/30/18 [phone]

## 2019-08-07 DIAGNOSIS — Z79899 Other long term (current) drug therapy: Secondary | ICD-10-CM | POA: Diagnosis not present

## 2019-08-07 DIAGNOSIS — H30033 Focal chorioretinal inflammation, peripheral, bilateral: Secondary | ICD-10-CM | POA: Diagnosis not present

## 2019-08-07 DIAGNOSIS — H3581 Retinal edema: Secondary | ICD-10-CM | POA: Diagnosis not present

## 2019-09-23 ENCOUNTER — Other Ambulatory Visit: Payer: Self-pay

## 2019-09-23 ENCOUNTER — Emergency Department (HOSPITAL_COMMUNITY): Payer: Medicare Other

## 2019-09-23 ENCOUNTER — Emergency Department (HOSPITAL_COMMUNITY)
Admission: EM | Admit: 2019-09-23 | Discharge: 2019-09-23 | Disposition: A | Discharge status: Home / Self Care | Payer: Medicare Other | Attending: Emergency Medicine | Admitting: Emergency Medicine

## 2019-09-23 ENCOUNTER — Encounter (HOSPITAL_COMMUNITY): Payer: Self-pay

## 2019-09-23 DIAGNOSIS — N201 Calculus of ureter: Secondary | ICD-10-CM | POA: Diagnosis not present

## 2019-09-23 DIAGNOSIS — Z79899 Other long term (current) drug therapy: Secondary | ICD-10-CM | POA: Insufficient documentation

## 2019-09-23 DIAGNOSIS — N132 Hydronephrosis with renal and ureteral calculous obstruction: Secondary | ICD-10-CM | POA: Diagnosis not present

## 2019-09-23 DIAGNOSIS — R1031 Right lower quadrant pain: Secondary | ICD-10-CM | POA: Diagnosis not present

## 2019-09-23 DIAGNOSIS — F17210 Nicotine dependence, cigarettes, uncomplicated: Secondary | ICD-10-CM | POA: Insufficient documentation

## 2019-09-23 LAB — CBC
HCT: 47.7 % (ref 39.0–52.0)
Hemoglobin: 16 g/dL (ref 13.0–17.0)
MCH: 31.6 pg (ref 26.0–34.0)
MCHC: 33.5 g/dL (ref 30.0–36.0)
MCV: 94.1 fL (ref 80.0–100.0)
Platelets: 256 10*3/uL (ref 150–400)
RBC: 5.07 MIL/uL (ref 4.22–5.81)
RDW: 12.4 % (ref 11.5–15.5)
WBC: 6.4 10*3/uL (ref 4.0–10.5)
nRBC: 0 % (ref 0.0–0.2)

## 2019-09-23 LAB — COMPREHENSIVE METABOLIC PANEL
ALT: 18 U/L (ref 0–44)
AST: 17 U/L (ref 15–41)
Albumin: 4.6 g/dL (ref 3.5–5.0)
Alkaline Phosphatase: 42 U/L (ref 38–126)
Anion gap: 12 (ref 5–15)
BUN: 12 mg/dL (ref 6–20)
CO2: 20 mmol/L — ABNORMAL LOW (ref 22–32)
Calcium: 9.9 mg/dL (ref 8.9–10.3)
Chloride: 108 mmol/L (ref 98–111)
Creatinine, Ser: 1.05 mg/dL (ref 0.61–1.24)
GFR calc Af Amer: >60 mL/min (ref >60)
GFR calc non Af Amer: >60 mL/min (ref >60)
Glucose, Bld: 142 mg/dL — ABNORMAL HIGH (ref 70–99)
Potassium: 4.2 mmol/L (ref 3.5–5.1)
Sodium: 140 mmol/L (ref 135–145)
Total Bilirubin: 0.6 mg/dL (ref 0.3–1.2)
Total Protein: 7 g/dL (ref 6.5–8.1)

## 2019-09-23 LAB — URINALYSIS, ROUTINE W REFLEX MICROSCOPIC
Bilirubin Urine: NEGATIVE
Glucose, UA: NEGATIVE mg/dL
Ketones, ur: NEGATIVE mg/dL
Leukocytes,Ua: NEGATIVE
Nitrite: NEGATIVE
Protein, ur: 30 mg/dL — AB
Specific Gravity, Urine: 1.019 (ref 1.005–1.030)
pH: 8 (ref 5.0–8.0)

## 2019-09-23 LAB — LIPASE, BLOOD: Lipase: 36 U/L (ref 11–51)

## 2019-09-23 MED ORDER — SODIUM CHLORIDE 0.9 % IV BOLUS
500.0000 mL | Freq: Once | INTRAVENOUS | Status: AC
  Administered 2019-09-23: 500 mL via INTRAVENOUS

## 2019-09-23 MED ORDER — SODIUM CHLORIDE 0.9% FLUSH: 3.0000 mL | Freq: Once | INTRAVENOUS | Status: DC

## 2019-09-23 MED ORDER — OXYCODONE-ACETAMINOPHEN 5-325 MG PO TABS: 1.0000 | ORAL_TABLET | Freq: Four times a day (QID) | ORAL | 0 refills | Status: DC | PRN

## 2019-09-23 MED ORDER — MORPHINE SULFATE (PF) 4 MG/ML IV SOLN
4.0000 mg | Freq: Once | INTRAVENOUS | Status: AC
  Administered 2019-09-23: 4 mg via INTRAVENOUS
  Filled 2019-09-23: qty 1

## 2019-09-23 MED ORDER — ONDANSETRON HCL 4 MG/2ML IJ SOLN
4.0000 mg | Freq: Once | INTRAMUSCULAR | Status: AC
  Administered 2019-09-23: 4 mg via INTRAVENOUS
  Filled 2019-09-23: qty 2

## 2019-09-23 MED ORDER — IBUPROFEN 600 MG PO TABS: 600.0000 mg | ORAL_TABLET | Freq: Four times a day (QID) | ORAL | 0 refills | Status: DC | PRN

## 2019-09-23 MED ORDER — TAMSULOSIN HCL 0.4 MG PO CAPS: 0.4000 mg | ORAL_CAPSULE | Freq: Every day | ORAL | 0 refills | Status: DC

## 2019-09-23 MED ORDER — ONDANSETRON HCL 4 MG PO TABS: 4.0000 mg | ORAL_TABLET | Freq: Four times a day (QID) | ORAL | 0 refills | Status: DC

## 2019-09-23 MED ORDER — ONDANSETRON 4 MG PO TBDP
4.0000 mg | ORAL_TABLET | Freq: Once | ORAL | Status: AC | PRN
  Administered 2019-09-23: 4 mg via ORAL
  Filled 2019-09-23: qty 1

## 2019-09-23 MED ORDER — IOHEXOL 300 MG/ML  SOLN
100.0000 mL | Freq: Once | INTRAMUSCULAR | Status: AC | PRN
  Administered 2019-09-23: 100 mL via INTRAVENOUS

## 2019-09-23 MED ORDER — KETOROLAC TROMETHAMINE 30 MG/ML IJ SOLN
30.0000 mg | Freq: Once | INTRAMUSCULAR | Status: AC
  Administered 2019-09-23: 30 mg via INTRAVENOUS
  Filled 2019-09-23: qty 1

## 2019-09-23 NOTE — Discharge Instructions (Addendum)
Take ibuprofen as prescribed every 6 hours as needed for pain.  For severe, breakthrough pain, take 1-2 Percocet every 6 hours.  Do not drive or operate machinery while taking this medication.  Take Zofran every 6 hours as needed for nausea or vomiting.  Take Flomax once daily after dinner, before bed.  This medicine can make you feel lightheaded when you stand up and thus it is better to take it close to bedtime. Please follow-up with the urologist within 1 week for further evaluation and treatment.  Strain your urine each time you go and save the stone, if possible and bring it to your appointment.  Do not drink alcohol, drive, operate machinery or participate in any other potentially dangerous activities while taking opiate pain medication as it may make you sleepy. Do not take this medication with any other sedating medications, either prescription or over-the-counter. If you were prescribed Percocet or Vicodin, do not take these with acetaminophen (Tylenol) as it is already contained within these medications and overdose of Tylenol is dangerous.   This medication is an opiate (or narcotic) pain medication and can be habit forming.  Use it as little as possible to achieve adequate pain control.  Do not use or use it with extreme caution if you have a history of opiate abuse or dependence. This medication is intended for your use only - do not give any to anyone else and keep it in a secure place where nobody else, especially children, have access to it. It will also cause or worsen constipation, so you may want to consider taking an over-the-counter stool softener while you are taking this medication.

## 2019-09-23 NOTE — ED Notes (Signed)
Updated on wait for treatment room. 

## 2019-09-23 NOTE — ED Provider Notes (Signed)
[facility] Provider Note   CSN: [id number] Arrival date & time: 09/23/19  [id number]     History   Chief Complaint No chief complaint on file.   HPI Eddie Brown is a 28 y.o. adult with history of kidney stones presents with sudden onset right lower quadrant pain.  It radiates to his back and scrotum.  He reports he feels like someone kicked him in the scrotum.  He has had associated nausea and vomiting.  He reports history of 1 kidney stone 4 to 5 years ago, but does not member if this feels similar.  He denies any urinary changes, however has not had the pain for very long.  He did not take any medication at home for symptoms.  Patient reports he had diarrhea 2 days ago.  It was nonbloody.  He denies any fevers, chest pain, shortness of breath.  He denies any concern for STD exposure.     HPI  History reviewed. No pertinent past medical history.  There are no active problems to display for this patient.   History reviewed. No pertinent surgical history.      Home Medications    Prior to Admission medications   Medication Sig Start Date End Date Taking? Authorizing Provider  acetaZOLAMIDE (DIAMOX) 250 MG tablet Take 250 mg by mouth at bedtime.    [provider]  ibuprofen (ADVIL) 600 MG tablet Take 1 tablet (600 mg total) by mouth every 6 (six) hours as needed. 09/23/19   Dashanae Longfield, Waylan BogaAlexandra M, PA-C  ondansetron (ZOFRAN) 4 MG tablet Take 1 tablet (4 mg total) by mouth every 6 (six) hours. 09/23/19   Emi HolesLaw, Hercules Hasler M, PA-C  oxyCODONE-acetaminophen (PERCOCET/ROXICET) 5-325 MG tablet Take 1-2 tablets by mouth every 6 (six) hours as needed for severe pain. 09/23/19   Emi HolesLaw, Quentavious Rittenhouse M, PA-C  tamsulosin (FLOMAX) 0.4 MG CAPS capsule Take 1 capsule (0.4 mg total) by mouth daily after supper. 09/23/19   Emi HolesLaw, Permelia Bamba M, PA-C    Family History Family History  Problem Relation Age of Onset  . Cancer Father     Social History Social  History   Tobacco Use  . Smoking status: Current Some Day Smoker    Packs/day: 0.50    Types: Cigarettes  . Smokeless tobacco: Never Used  Substance Use Topics  . Alcohol use: Yes    Comment: occasionally  . Drug use: Yes    Types: Marijuana    Comment: occasionally     Allergies   Patient has no known allergies.   Review of Systems Review of Systems  Constitutional: Negative for chills and fever.  HENT: Negative for facial swelling and sore throat.   Respiratory: Negative for shortness of breath.   Cardiovascular: Negative for chest pain.  Gastrointestinal: Positive for abdominal pain, diarrhea, nausea and vomiting.  Genitourinary: Negative for dysuria.  Musculoskeletal: Negative for back pain.  Skin: Negative for rash and wound.  Neurological: Negative for headaches.  Psychiatric/Behavioral: The patient is not nervous/anxious.      Physical Exam Updated Vital Signs BP 110/68 (BP Location: Left Arm)   Pulse (!) 45   Temp 97.6 F (36.4 C) (Oral)   Resp 18   SpO2 99%   Physical Exam Vitals signs and nursing note reviewed. Exam conducted with a chaperone present.  Constitutional:      General: She is not in acute distress.    Appearance: She is well-developed. She is not diaphoretic.  HENT:  Head: Normocephalic and atraumatic.     Mouth/Throat:     Pharynx: No oropharyngeal exudate.  Eyes:     General: No scleral icterus.       Right eye: No discharge.        Left eye: No discharge.     Conjunctiva/sclera: Conjunctivae normal.     Pupils: Pupils are equal, round, and reactive to light.  Neck:     Musculoskeletal: Normal range of motion and neck supple.     Thyroid: No thyromegaly.  Cardiovascular:     Rate and Rhythm: Regular rhythm.     Heart sounds: Normal heart sounds. No murmur. No friction rub. No gallop.   Pulmonary:     Effort: Pulmonary effort is normal. No respiratory distress.     Breath sounds: Normal breath sounds. No stridor. No  wheezing or rales.  Abdominal:     General: Bowel sounds are normal. There is no distension.     Palpations: Abdomen is soft.     Tenderness: There is abdominal tenderness in the right lower quadrant. There is no guarding or rebound.    Genitourinary:    Penis: Normal and uncircumcised.      Scrotum/Testes: Normal.        Right: Tenderness or swelling not present.        Left: Tenderness or swelling not present.  Lymphadenopathy:     Cervical: No cervical adenopathy.  Skin:    General: Skin is warm and dry.     Coloration: Skin is not pale.     Findings: No rash.  Neurological:     Mental Status: She is alert.     Coordination: Coordination normal.      ED Treatments / Results  Labs (all labs ordered are listed, but only abnormal results are displayed) Labs Reviewed  COMPREHENSIVE METABOLIC PANEL - Abnormal; Notable for the following components:      Result Value   CO2 20 (*)    Glucose, Bld 142 (*)    All other components within normal limits  URINALYSIS, ROUTINE W REFLEX MICROSCOPIC - Abnormal; Notable for the following components:   Color, Urine AMBER (*)    APPearance TURBID (*)    Hgb urine dipstick LARGE (*)    Protein, ur 30 (*)    Bacteria, UA RARE (*)    All other components within normal limits  URINE CULTURE  LIPASE, BLOOD  CBC    EKG None  Radiology Ct Abdomen Pelvis W Contrast  Result Date: 09/23/2019 CLINICAL DATA:  Acute right lower quadrant abdominal pain. EXAM: CT ABDOMEN AND PELVIS WITH CONTRAST TECHNIQUE: Multidetector CT imaging of the abdomen and pelvis was performed using the standard protocol following bolus administration of intravenous contrast. CONTRAST:  OMNIPAQUE IOHEXOL 300 MG/ML  SOLN COMPARISON:  May 17, 2015. FINDINGS: Lower chest: No acute abnormality. Hepatobiliary: No focal liver abnormality is seen. No gallstones, gallbladder wall thickening, or biliary dilatation. Pancreas: Unremarkable. No pancreatic ductal dilatation or  surrounding inflammatory changes. Spleen: Normal in size without focal abnormality. Adrenals/Urinary Tract: Adrenal glands appear normal. Bilateral nephrolithiasis is noted. Mild right hydroureteronephrosis is noted secondary to 4 mm calculus in distal right ureter. Urinary bladder is unremarkable. Stomach/Bowel: Stomach is within normal limits. Appendix appears normal. No evidence of bowel wall thickening, distention, or inflammatory changes. Vascular/Lymphatic: No significant vascular findings are present. No enlarged abdominal or pelvic lymph nodes. Reproductive: Prostate is unremarkable. Other: No abdominal wall hernia or abnormality. No abdominopelvic ascites. Musculoskeletal: No acute  or significant osseous findings. IMPRESSION: Bilateral nephrolithiasis. Mild right hydroureteronephrosis is noted secondary to 4 mm distal right ureteral calculus. Electronically Signed   By: Marijo Conception M.D.   On: 09/23/2019 14:42    Procedures Procedures (including critical care time)  Medications Ordered in ED Medications  sodium chloride flush (NS) 0.9 % injection 3 mL (has no administration in time range)  ondansetron (ZOFRAN-ODT) disintegrating tablet 4 mg (4 mg Oral Given 09/23/19 1015)  ondansetron (ZOFRAN) injection 4 mg (4 mg Intravenous Given 09/23/19 1330)  sodium chloride 0.9 % bolus 500 mL (0 mLs Intravenous Stopped 09/23/19 1409)  morphine 4 MG/ML injection 4 mg (4 mg Intravenous Given 09/23/19 1331)  iohexol (OMNIPAQUE) 300 MG/ML solution 100 mL (100 mLs Intravenous Contrast Given 09/23/19 1426)  ketorolac (TORADOL) 30 MG/ML injection 30 mg (30 mg Intravenous Given 09/23/19 1547)     Initial Impression / Assessment and Plan / ED Course  I have reviewed the triage vital signs and the nursing notes.  Pertinent labs & imaging results that were available during my care of the patient were reviewed by me and considered in my medical decision making (see chart for details).        Patient found  to have 4 mm stone in the right ureter with mild hydronephrosis.  Renal function within normal limits.  UA shows large hematuria, 21-50 RBCs and WBCs.  Low suspicion of infection considering rare bacteria, negative leukocytes, negative nitrites.  Patient is afebrile.  Will culture, but will not begin treatment at this time, especially with patient's symptoms beginning today and stone seen.  Pain control with morphine and Toradol in the ED.  Will discharge home with ibuprofen, Percocet, Flomax, Zofran.  Follow-up to urology discussed.  Return precautions discussed.  Patient understands and agrees with plan.  Patient vitals stable throughout ED course and discharged in satisfactory condition.  Final Clinical Impressions(s) / ED Diagnoses   Final diagnoses:  Ureterolithiasis    ED Discharge Orders         Ordered    oxyCODONE-acetaminophen (PERCOCET/ROXICET) 5-325 MG tablet  Every 6 hours PRN     09/23/19 1603    tamsulosin (FLOMAX) 0.4 MG CAPS capsule  Daily after supper     09/23/19 1603    ibuprofen (ADVIL) 600 MG tablet  Every 6 hours PRN     09/23/19 1603    ondansetron (ZOFRAN) 4 MG tablet  Every 6 hours     09/23/19 1603           Frederica Kuster, PA-C 09/23/19 1608    Wyvonnia Dusky, MD 09/23/19 1742

## 2019-09-23 NOTE — ED Triage Notes (Signed)
Patient complains of abdominal pain that started this am with nausea. On arrival dry heaves. Patient actively vomiting during triage. Denies diarrhea, no CP, denies fever

## 2019-09-24 LAB — URINE CULTURE: Culture: NO GROWTH

## 2020-01-11 ENCOUNTER — Emergency Department (HOSPITAL_COMMUNITY): Payer: Medicare Other | Admitting: Registered Nurse

## 2020-01-11 ENCOUNTER — Emergency Department (HOSPITAL_COMMUNITY): Payer: Medicare Other

## 2020-01-11 ENCOUNTER — Other Ambulatory Visit: Payer: Self-pay

## 2020-01-11 ENCOUNTER — Encounter (HOSPITAL_COMMUNITY): Payer: Self-pay | Admitting: Emergency Medicine

## 2020-01-11 ENCOUNTER — Encounter (HOSPITAL_COMMUNITY)
Admission: EM | Disposition: A | Discharge status: Home / Self Care | Payer: Self-pay | Source: Home / Self Care | Attending: Emergency Medicine

## 2020-01-11 ENCOUNTER — Observation Stay (HOSPITAL_COMMUNITY)
Admission: EM | Admit: 2020-01-11 | Discharge: 2020-01-12 | Disposition: A | Discharge status: Home / Self Care | Payer: Medicare Other | Attending: Vascular Surgery | Admitting: Vascular Surgery

## 2020-01-11 DIAGNOSIS — S45101A Unspecified injury of brachial artery, right side, initial encounter: Secondary | ICD-10-CM | POA: Insufficient documentation

## 2020-01-11 DIAGNOSIS — Z79899 Other long term (current) drug therapy: Secondary | ICD-10-CM | POA: Insufficient documentation

## 2020-01-11 DIAGNOSIS — M79601 Pain in right arm: Secondary | ICD-10-CM | POA: Diagnosis not present

## 2020-01-11 DIAGNOSIS — W208XXA Other cause of strike by thrown, projected or falling object, initial encounter: Secondary | ICD-10-CM | POA: Insufficient documentation

## 2020-01-11 DIAGNOSIS — M7989 Other specified soft tissue disorders: Secondary | ICD-10-CM | POA: Diagnosis not present

## 2020-01-11 DIAGNOSIS — I742 Embolism and thrombosis of arteries of the upper extremities: Secondary | ICD-10-CM | POA: Diagnosis present

## 2020-01-11 DIAGNOSIS — Z20822 Contact with and (suspected) exposure to covid-19: Secondary | ICD-10-CM | POA: Insufficient documentation

## 2020-01-11 DIAGNOSIS — I998 Other disorder of circulatory system: Secondary | ICD-10-CM | POA: Diagnosis not present

## 2020-01-11 DIAGNOSIS — S5701XA Crushing injury of right elbow, initial encounter: Principal | ICD-10-CM | POA: Insufficient documentation

## 2020-01-11 DIAGNOSIS — F17210 Nicotine dependence, cigarettes, uncomplicated: Secondary | ICD-10-CM | POA: Insufficient documentation

## 2020-01-11 DIAGNOSIS — Z03818 Encounter for observation for suspected exposure to other biological agents ruled out: Secondary | ICD-10-CM | POA: Diagnosis not present

## 2020-01-11 DIAGNOSIS — I70208 Unspecified atherosclerosis of native arteries of extremities, other extremity: Secondary | ICD-10-CM

## 2020-01-11 DIAGNOSIS — S45191A Other specified injury of brachial artery, right side, initial encounter: Secondary | ICD-10-CM | POA: Diagnosis not present

## 2020-01-11 DIAGNOSIS — S59901A Unspecified injury of right elbow, initial encounter: Secondary | ICD-10-CM | POA: Diagnosis not present

## 2020-01-11 HISTORY — PX: VEIN HARVEST: SHX6363

## 2020-01-11 HISTORY — PX: BYPASS AXILLA/BRACHIAL ARTERY: SHX6426

## 2020-01-11 LAB — CBC WITH DIFFERENTIAL/PLATELET
Abs Immature Granulocytes: 0.13 10*3/uL — ABNORMAL HIGH (ref 0.00–0.07)
Basophils Absolute: 0.1 10*3/uL (ref 0.0–0.1)
Basophils Relative: 0 %
Eosinophils Absolute: 0 10*3/uL (ref 0.0–0.5)
Eosinophils Relative: 0 %
HCT: 47.6 % (ref 39.0–52.0)
Hemoglobin: 15.8 g/dL (ref 13.0–17.0)
Immature Granulocytes: 1 %
Lymphocytes Relative: 4 %
Lymphs Abs: 0.7 10*3/uL (ref 0.7–4.0)
MCH: 31.1 pg (ref 26.0–34.0)
MCHC: 33.2 g/dL (ref 30.0–36.0)
MCV: 93.7 fL (ref 80.0–100.0)
Monocytes Absolute: 1 10*3/uL (ref 0.1–1.0)
Monocytes Relative: 5 %
Neutro Abs: 16.7 10*3/uL — ABNORMAL HIGH (ref 1.7–7.7)
Neutrophils Relative %: 90 %
Platelets: 206 10*3/uL (ref 150–400)
RBC: 5.08 MIL/uL (ref 4.22–5.81)
RDW: 12.7 % (ref 11.5–15.5)
WBC: 18.6 10*3/uL — ABNORMAL HIGH (ref 4.0–10.5)
nRBC: 0 % (ref 0.0–0.2)

## 2020-01-11 LAB — I-STAT CHEM 8, ED
BUN: 14 mg/dL (ref 6–20)
Calcium, Ion: 1.22 mmol/L (ref 1.15–1.40)
Chloride: 107 mmol/L (ref 98–111)
Creatinine, Ser: 0.7 mg/dL (ref 0.61–1.24)
Glucose, Bld: 94 mg/dL (ref 70–99)
HCT: 47 % (ref 39.0–52.0)
Hemoglobin: 16 g/dL (ref 13.0–17.0)
Potassium: 3.7 mmol/L (ref 3.5–5.1)
Sodium: 140 mmol/L (ref 135–145)
TCO2: 24 mmol/L (ref 22–32)

## 2020-01-11 LAB — RESPIRATORY PANEL BY RT PCR (FLU A&B, COVID)
Influenza A by PCR: NEGATIVE
Influenza B by PCR: NEGATIVE
SARS Coronavirus 2 by RT PCR: NEGATIVE

## 2020-01-11 LAB — BASIC METABOLIC PANEL
Anion gap: 12 (ref 5–15)
BUN: 15 mg/dL (ref 6–20)
CO2: 21 mmol/L — ABNORMAL LOW (ref 22–32)
Calcium: 9.2 mg/dL (ref 8.9–10.3)
Chloride: 106 mmol/L (ref 98–111)
Creatinine, Ser: 0.86 mg/dL (ref 0.61–1.24)
GFR calc Af Amer: >60 mL/min (ref >60)
GFR calc non Af Amer: >60 mL/min (ref >60)
Glucose, Bld: 107 mg/dL — ABNORMAL HIGH (ref 70–99)
Potassium: 4.1 mmol/L (ref 3.5–5.1)
Sodium: 139 mmol/L (ref 135–145)

## 2020-01-11 SURGERY — SURGICAL PROCUREMENT, VEIN
Anesthesia: General | Laterality: Right

## 2020-01-11 MED ORDER — LIDOCAINE 2% (20 MG/ML) 5 ML SYRINGE
INTRAMUSCULAR | Status: DC | PRN
  Administered 2020-01-11: 80 mg via INTRAVENOUS

## 2020-01-11 MED ORDER — HEPARIN SODIUM (PORCINE) 1000 UNIT/ML IJ SOLN
INTRAMUSCULAR | Status: DC | PRN
  Administered 2020-01-11: 8000 [IU] via INTRAVENOUS

## 2020-01-11 MED ORDER — SUGAMMADEX SODIUM 200 MG/2ML IV SOLN
INTRAVENOUS | Status: DC | PRN
  Administered 2020-01-11: 200 mg via INTRAVENOUS

## 2020-01-11 MED ORDER — SODIUM CHLORIDE 0.9 % IV SOLN
INTRAVENOUS | Status: AC
  Filled 2020-01-11: qty 1.2

## 2020-01-11 MED ORDER — DIPHENHYDRAMINE HCL 50 MG/ML IJ SOLN
INTRAMUSCULAR | Status: DC | PRN
  Administered 2020-01-11: 12.5 mg via INTRAVENOUS

## 2020-01-11 MED ORDER — OXYCODONE-ACETAMINOPHEN 5-325 MG PO TABS: 1.0000 | ORAL_TABLET | Freq: Once | ORAL | Status: DC

## 2020-01-11 MED ORDER — CEFAZOLIN SODIUM-DEXTROSE 2-3 GM-%(50ML) IV SOLR
INTRAVENOUS | Status: DC | PRN
  Administered 2020-01-11: 2 g via INTRAVENOUS

## 2020-01-11 MED ORDER — 0.9 % SODIUM CHLORIDE (POUR BTL) OPTIME
TOPICAL | Status: DC | PRN
  Administered 2020-01-11: 1000 mL

## 2020-01-11 MED ORDER — LACTATED RINGERS IV SOLN: INTRAVENOUS | Status: DC | PRN

## 2020-01-11 MED ORDER — IOHEXOL 350 MG/ML SOLN
100.0000 mL | Freq: Once | INTRAVENOUS | Status: AC | PRN
  Administered 2020-01-11: 100 mL via INTRAVENOUS

## 2020-01-11 MED ORDER — PROPOFOL 10 MG/ML IV BOLUS
INTRAVENOUS | Status: AC
  Filled 2020-01-11: qty 20

## 2020-01-11 MED ORDER — CEFAZOLIN SODIUM 1 G IJ SOLR
INTRAMUSCULAR | Status: AC
  Filled 2020-01-11: qty 20

## 2020-01-11 MED ORDER — MIDAZOLAM HCL 2 MG/2ML IJ SOLN
INTRAMUSCULAR | Status: AC
  Filled 2020-01-11: qty 2

## 2020-01-11 MED ORDER — DEXAMETHASONE SODIUM PHOSPHATE 10 MG/ML IJ SOLN
INTRAMUSCULAR | Status: DC | PRN
  Administered 2020-01-11: 10 mg via INTRAVENOUS

## 2020-01-11 MED ORDER — PROTAMINE SULFATE 10 MG/ML IV SOLN
INTRAVENOUS | Status: DC | PRN
  Administered 2020-01-11: 30 mg via INTRAVENOUS
  Administered 2020-01-11 (×2): 10 mg via INTRAVENOUS

## 2020-01-11 MED ORDER — PROPOFOL 10 MG/ML IV BOLUS
INTRAVENOUS | Status: DC | PRN
  Administered 2020-01-11: 150 mg via INTRAVENOUS

## 2020-01-11 MED ORDER — ONDANSETRON 4 MG PO TBDP
4.0000 mg | ORAL_TABLET | Freq: Once | ORAL | Status: AC
  Administered 2020-01-11: 4 mg via ORAL
  Filled 2020-01-11: qty 1

## 2020-01-11 MED ORDER — FENTANYL CITRATE (PF) 250 MCG/5ML IJ SOLN
INTRAMUSCULAR | Status: DC | PRN
  Administered 2020-01-11 (×3): 50 ug via INTRAVENOUS
  Administered 2020-01-11: 25 ug via INTRAVENOUS
  Administered 2020-01-11: 75 ug via INTRAVENOUS
  Administered 2020-01-11: 50 ug via INTRAVENOUS
  Administered 2020-01-11: 100 ug via INTRAVENOUS

## 2020-01-11 MED ORDER — FENTANYL CITRATE (PF) 100 MCG/2ML IJ SOLN: 25.0000 ug | INTRAMUSCULAR | Status: DC | PRN

## 2020-01-11 MED ORDER — ROCURONIUM BROMIDE 10 MG/ML (PF) SYRINGE
PREFILLED_SYRINGE | INTRAVENOUS | Status: AC
  Filled 2020-01-11: qty 10

## 2020-01-11 MED ORDER — SUCCINYLCHOLINE CHLORIDE 200 MG/10ML IV SOSY
PREFILLED_SYRINGE | INTRAVENOUS | Status: AC
  Filled 2020-01-11: qty 10

## 2020-01-11 MED ORDER — PHENYLEPHRINE 40 MCG/ML (10ML) SYRINGE FOR IV PUSH (FOR BLOOD PRESSURE SUPPORT)
PREFILLED_SYRINGE | INTRAVENOUS | Status: AC
  Filled 2020-01-11: qty 10

## 2020-01-11 MED ORDER — MIDAZOLAM HCL 5 MG/5ML IJ SOLN
INTRAMUSCULAR | Status: DC | PRN
  Administered 2020-01-11: 2 mg via INTRAVENOUS

## 2020-01-11 MED ORDER — FENTANYL CITRATE (PF) 250 MCG/5ML IJ SOLN
INTRAMUSCULAR | Status: AC
  Filled 2020-01-11: qty 5

## 2020-01-11 MED ORDER — ONDANSETRON HCL 4 MG/2ML IJ SOLN
INTRAMUSCULAR | Status: DC | PRN
  Administered 2020-01-11: 4 mg via INTRAVENOUS

## 2020-01-11 MED ORDER — OXYCODONE-ACETAMINOPHEN 5-325 MG PO TABS
1.0000 | ORAL_TABLET | ORAL | Status: AC | PRN
  Administered 2020-01-11 (×2): 1 via ORAL
  Filled 2020-01-11 (×2): qty 1

## 2020-01-11 MED ORDER — LIDOCAINE 2% (20 MG/ML) 5 ML SYRINGE
INTRAMUSCULAR | Status: AC
  Filled 2020-01-11: qty 5

## 2020-01-11 MED ORDER — FENTANYL CITRATE (PF) 100 MCG/2ML IJ SOLN
INTRAMUSCULAR | Status: AC
  Administered 2020-01-12: 50 ug via INTRAVENOUS
  Filled 2020-01-11: qty 2

## 2020-01-11 MED ORDER — SODIUM CHLORIDE 0.9 % IV SOLN
INTRAVENOUS | Status: DC | PRN
  Administered 2020-01-11: 20:00:00 500 mL

## 2020-01-11 MED ORDER — EPHEDRINE 5 MG/ML INJ
INTRAVENOUS | Status: AC
  Filled 2020-01-11: qty 10

## 2020-01-11 MED ORDER — ROCURONIUM BROMIDE 10 MG/ML (PF) SYRINGE
PREFILLED_SYRINGE | INTRAVENOUS | Status: DC | PRN
  Administered 2020-01-11 (×2): 10 mg via INTRAVENOUS
  Administered 2020-01-11: 50 mg via INTRAVENOUS

## 2020-01-11 MED ORDER — SUCCINYLCHOLINE CHLORIDE 200 MG/10ML IV SOSY
PREFILLED_SYRINGE | INTRAVENOUS | Status: DC | PRN
  Administered 2020-01-11: 100 mg via INTRAVENOUS

## 2020-01-11 MED ORDER — HEPARIN SODIUM (PORCINE) 1000 UNIT/ML IJ SOLN
INTRAMUSCULAR | Status: AC
  Filled 2020-01-11: qty 1

## 2020-01-11 SURGICAL SUPPLY — 29 items
ARMBAND PINK RESTRICT EXTREMIT (MISCELLANEOUS) IMPLANT
CANISTER SUCT 3000ML PPV (MISCELLANEOUS) ×4 IMPLANT
CANNULA VESSEL 3MM 2 BLNT TIP (CANNULA) ×4 IMPLANT
CATH EMB 3FR 80CM (CATHETERS) IMPLANT
CATH EMB 4FR 80CM (CATHETERS) IMPLANT
CATH EMB 5FR 80CM (CATHETERS) IMPLANT
CLIP LIGATING EXTRA MED SLVR (CLIP) ×4 IMPLANT
CLIP LIGATING EXTRA SM BLUE (MISCELLANEOUS) ×4 IMPLANT
COVER WAND RF STERILE (DRAPES) ×4 IMPLANT
DECANTER SPIKE VIAL GLASS SM (MISCELLANEOUS) IMPLANT
DERMABOND ADVANCED (GAUZE/BANDAGES/DRESSINGS) ×4
DERMABOND ADVANCED .7 DNX12 (GAUZE/BANDAGES/DRESSINGS) ×4 IMPLANT
ELECT REM PT RETURN 9FT ADLT (ELECTROSURGICAL) ×4
ELECTRODE REM PT RTRN 9FT ADLT (ELECTROSURGICAL) ×2 IMPLANT
GLOVE SS BIOGEL STRL SZ 7.5 (GLOVE) ×2 IMPLANT
GLOVE SUPERSENSE BIOGEL SZ 7.5 (GLOVE) ×2
GOWN STRL REUS W/ TWL LRG LVL3 (GOWN DISPOSABLE) ×6 IMPLANT
GOWN STRL REUS W/TWL LRG LVL3 (GOWN DISPOSABLE) ×12
KIT BASIN OR (CUSTOM PROCEDURE TRAY) ×4 IMPLANT
KIT TURNOVER KIT B (KITS) ×4 IMPLANT
NS IRRIG 1000ML POUR BTL (IV SOLUTION) ×4 IMPLANT
PACK CV ACCESS (CUSTOM PROCEDURE TRAY) ×4 IMPLANT
PAD ARMBOARD 7.5X6 YLW CONV (MISCELLANEOUS) ×8 IMPLANT
SUT PROLENE 6 0 CC (SUTURE) ×12 IMPLANT
SUT VIC AB 3-0 SH 27 (SUTURE) ×12
SUT VIC AB 3-0 SH 27X BRD (SUTURE) ×6 IMPLANT
TOWEL GREEN STERILE (TOWEL DISPOSABLE) ×4 IMPLANT
UNDERPAD 30X30 (UNDERPADS AND DIAPERS) ×4 IMPLANT
WATER STERILE IRR 1000ML POUR (IV SOLUTION) ×4 IMPLANT

## 2020-01-11 NOTE — Anesthesia Procedure Notes (Signed)
Procedure Name: Intubation Date/Time: 01/11/2020 9:14 PM Performed by: Jearld Pies, CRNA Pre-anesthesia Checklist: Patient identified, Emergency Drugs available, Suction available and Patient being monitored Patient Re-evaluated:Patient Re-evaluated prior to induction Oxygen Delivery Method: Circle System Utilized Preoxygenation: Pre-oxygenation with 100% oxygen Induction Type: IV induction, Rapid sequence and Cricoid Pressure applied Laryngoscope Size: Mac and 3 Grade View: Grade I Tube type: Oral Tube size: 7.5 mm Number of attempts: 1 Airway Equipment and Method: Stylet Placement Confirmation: ETT inserted through vocal cords under direct vision,  positive ETCO2 and breath sounds checked- equal and bilateral Secured at: 22 cm Tube secured with: Tape Dental Injury: Teeth and Oropharynx as per pre-operative assessment

## 2020-01-11 NOTE — Op Note (Signed)
OPERATIVE REPORT  DATE OF SURGERY: 01/11/2020  PATIENT: Eddie Brown, 29 y.o. 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  PHYSICIAN ASSISTANT: Matt Eveland, PA-C  ANESTHESIA: General  EBL: per anesthesia record  Total I/O In: 1550 [I.V.:1500; IV Piggyback:50] Out: 25 [Blood:25]  BLOOD ADMINISTERED: none  DRAINS: none  SPECIMEN: none  COUNTS CORRECT:  YES  PATIENT DISPOSITION:  PACU - hemodynamically stable  PROCEDURE DETAILS: The patient was taken to the operating placed supine position where the area of the right arm and right leg were prepped and draped in usual sterile fashion.  SonoSite was used to visualize the saphenous vein which was of adequate size in the right thigh.  Incision was made over the medial right upper arm arm over the traumatic area.  There was a great deal of hematoma present.  The basilic vein was large and was left intact.  Dissection was continued down to the neurovascular bundle.  Patient had a relatively small brachial artery.  This may be due to spasm.  The artery had excellent pulsatile proximally.  On dissection to the mid forearm there was an area of occlusion and extensive bruising and staining of the artery and the artery became normal again below this area.  The artery was controlled with Vesseloops above and below this disrupted area.  Next separate incision was made over the medial right thigh just above the knee carried down to isolate the saphenous vein.  The saphenous vein of good caliber.  The vein was ligated proximally and distally and a segment was harvested for bypass.  The patient was given 8000 units of intravenous  heparin.  After adequate circulation time the artery was occluded proximally distally was opened through the area of injury.  There was near transection of the artery with occlusion due to distal intimal flap.  The adventitia was also torn and very thin.  The artery was opened longitudinally above and below this to normal healthy artery.  The traumatized portion was resected.  The saphenous vein was brought onto the field and was reversed.  The vein was spatulated and sewn into into the proximal artery with a running 6-0 Prolene suture.  3 dilator passed easily through the anastomosis.  The anastomosis was tested and found to be adequate.  A Serafin clamp was placed on the vein graft.  The distal artery was also spatulated and the vein was cut to the appropriate length and was spatulated and sewn into into the distal artery with a running 6-0 Prolene suture.  Prior to completion of the closure the usual flushing maneuvers were undertaken.  Anastomosis was completed and the clamps were removed.  The patient had an easily palpable right radial pulse.  The patient was given 50 mg of protamine to reverse the heparin.  The wounds were irrigated with saline.  Hemostasis obtained electrocautery.  The wounds were closed with 3-0 Vicryl in the subcutaneous and subcuticular tissue at the vein harvest.  The fascia and subcutaneous tissue was closed in 2 layers with 3-0 Vicryl suture and the arm and the skin was closed with 3-0 subcuticular Vicryl stitch.  Dermabond was applied.  The patient was transferred to the recovery room in stable condition  Eddie Brown, M.D., [facility] 01/11/2020 11:08 PM

## 2020-01-11 NOTE — ED Triage Notes (Signed)
Rt arm injury after working on tree  And it landed and pinned his arm

## 2020-01-11 NOTE — ED Provider Notes (Signed)
[facility] EMERGENCY DEPARTMENT Provider Note   CSN: [id number] Arrival date & time: 01/11/20  1245     History Chief Complaint  Patient presents with  . Arm Pain    Eddie Brown is a 29 y.o. adult who presents to the ED with right arm pain subsequent to injury.  Patient reports that he was taking down limbs from a tree when one of the limbs tied to a rope swung and struck him on the medial aspect of his bicep over distal humerus.  Patient is presenting with 10 out of 10 arm pain that radiates down to his fingers.  He states that he appreciated a large, collection of blood in the area of his bicep that is slowly improving with administration of ice.  He denies any numbness, but states that his pain is excruciating and he is unable to flex or extend at the elbow, wrist, or MCP joints without significant pain or discomfort.  He has a superficial abrasion, but no open wounds.  He denies any chest pain, difficulty breathing, head injury, loss of consciousness, numbness, or other symptoms.  HPI     History reviewed. No pertinent past medical history.  There are no problems to display for this patient.   History reviewed. No pertinent surgical history.     Family History  Problem Relation Age of Onset  . Cancer Father     Social History   Tobacco Use  . Smoking status: Current Some Day Smoker    Packs/day: 0.50    Types: Cigarettes  . Smokeless tobacco: Never Used  Substance Use Topics  . Alcohol use: Yes    Comment: occasionally  . Drug use: Yes    Types: Marijuana    Comment: occasionally    Home Medications Prior to Admission medications   Medication Sig Start Date End Date Taking? Authorizing Provider  acetaZOLAMIDE (DIAMOX) 250 MG tablet Take 250 mg by mouth at bedtime.    [provider]  ibuprofen (ADVIL) 600 MG tablet Take 1 tablet (600 mg total) by mouth every 6 (six) hours as needed. 09/23/19   Law, Waylan Boga, PA-C   ondansetron (ZOFRAN) 4 MG tablet Take 1 tablet (4 mg total) by mouth every 6 (six) hours. 09/23/19   Emi Holes, PA-C  oxyCODONE-acetaminophen (PERCOCET/ROXICET) 5-325 MG tablet Take 1-2 tablets by mouth every 6 (six) hours as needed for severe pain. 09/23/19   Emi Holes, PA-C  tamsulosin (FLOMAX) 0.4 MG CAPS capsule Take 1 capsule (0.4 mg total) by mouth daily after supper. 09/23/19   Emi Holes, PA-C    Allergies    Patient has no known allergies.  Review of Systems   Review of Systems  Respiratory: Negative for shortness of breath.   Cardiovascular: Negative for chest pain and palpitations.  Skin: Positive for color change and wound.  Neurological: Negative for weakness and numbness.    Physical Exam Updated Vital Signs BP (!) 142/100 (BP Location: Left Arm)   Pulse (!) 53   Temp 98 F (36.7 C) (Oral)   Resp 17   Ht 5\' 8"  (1.727 m)   Wt 77.1 kg   SpO2 100%   BMI 25.85 kg/m   Physical Exam Vitals and nursing note reviewed. Exam conducted with a chaperone present.  Constitutional:      Appearance: Normal appearance.  HENT:     Head: Normocephalic and atraumatic.  Eyes:     General: No scleral icterus.  Conjunctiva/sclera: Conjunctivae normal.  Cardiovascular:     Rate and Rhythm: Normal rate and regular rhythm.     Pulses: Normal pulses.     Heart sounds: Normal heart sounds.  Pulmonary:     Effort: Pulmonary effort is normal. No respiratory distress.     Breath sounds: Normal breath sounds.  Musculoskeletal:     Comments: Right arm: Large area of ecchymosis and swelling noted over medial aspect of upper arm, specifically over bicep and distal humerus.  Significant TTP in area of humerus, but compartments remain soft.  TTP in forearm, as well.  Active flexion of elbow intact, albeit exquisitely limited due to pain and inflammation.  Pain in bicep and forearm with passive extension of wrist and fingers.  Difficult to assess for radial pulse.  Cap  refill intact.  Assessed radial, ulnar, and median nerves and sensation grossly intact.  Skin:    General: Skin is dry.  Neurological:     Mental Status: She is alert.     GCS: GCS eye subscore is 4. GCS verbal subscore is 5. GCS motor subscore is 6.  Psychiatric:        Mood and Affect: Mood normal.        Behavior: Behavior normal.        Thought Content: Thought content normal.     ED Results / Procedures / Treatments   Labs (all labs ordered are listed, but only abnormal results are displayed) Labs Reviewed  CBC WITH DIFFERENTIAL/PLATELET - Abnormal; Notable for the following components:      Result Value   WBC 18.6 (*)    Neutro Abs 16.7 (*)    Abs Immature Granulocytes 0.13 (*)    All other components within normal limits  BASIC METABOLIC PANEL - Abnormal; Notable for the following components:   CO2 21 (*)    Glucose, Bld 107 (*)    All other components within normal limits  RESPIRATORY PANEL BY RT PCR (FLU A&B, COVID)  I-STAT CHEM 8, ED    EKG None  Radiology CT ANGIO UP EXTREM RIGHT W &/OR WO CONTRAST  Result Date: 01/11/2020 CLINICAL DATA:  elbow trauma.  Suspect neurovascular injury EXAM: CT ANGIOGRAPHY OF THE RIGHT UPPEREXTREMITY TECHNIQUE: Multidetector CT imaging of the right upper extremity was performed using the standard protocol during bolus administration of intravenous contrast. Multiplanar CT image reconstructions and MIPs were obtained to evaluate the vascular anatomy. CONTRAST:  132mL OMNIPAQUE IOHEXOL 350 MG/ML SOLN COMPARISON:  None. FINDINGS: There is occlusion of the right brachial artery at approximately the level of the mid right humerus. Reconstitution of the brachial artery just above the elbow. The arteries in the right forearm are patent. No acute bony abnormality.  Soft tissue swelling in the upper arm. Review of the MIP images confirms the above findings. IMPRESSION: Occlusion of the mid right brachial artery with reconstitution just above the  elbow. Electronically Signed   By: Rolm Baptise M.D.   On: 01/11/2020 19:14   DG Humerus Right  Result Date: 01/11/2020 CLINICAL DATA:  Recent blunt trauma to the right arm with pain, initial encounter EXAM: RIGHT HUMERUS - 2+ VIEW COMPARISON:  None. FINDINGS: Soft tissue swelling is noted distally consistent with the recent injury. No acute fracture or dislocation is noted. No radiopaque foreign body is seen. IMPRESSION: Soft tissue swelling without acute bony abnormality. Electronically Signed   By: Inez Catalina M.D.   On: 01/11/2020 14:09    Procedures Procedures (including critical care time)  Medications Ordered in ED Medications  oxyCODONE-acetaminophen (PERCOCET/ROXICET) 5-325 MG per tablet 1 tablet (1 tablet Oral Given 01/11/20 1607)  ondansetron (ZOFRAN-ODT) disintegrating tablet 4 mg (4 mg Oral Given 01/11/20 1319)  iohexol (OMNIPAQUE) 350 MG/ML injection 100 mL (100 mLs Intravenous Contrast Given 01/11/20 1828)    ED Course  I have reviewed the triage vital signs and the nursing notes.  Pertinent labs & imaging results that were available during my care of the patient were reviewed by me and considered in my medical decision making (see chart for details).    MDM Rules/Calculators/A&P                      Reviewed patient's plain films obtained of right humerus which demonstrated soft tissue swelling, but without any acute bony abnormalities.  However, given patient's exquisite pain, pain worsened with passive extension of fingers, questionable radial pulse, and other concerning physical exam findings, consulted with Dr. Effie Shy regarding concern for compartment syndrome versus vascular pathology.  Patient provided percocet for pain symptoms.    Dr. Effie Shy discussed this case with Dr. Arbie Cookey, vascular, will evaluate patient after we obtain CTA right arm.  CTA demonstrates occlusion of the mid right brachial artery with reconstitution just above the elbow.  Arteries of the forearm  appear patent.  Will await evaluation by Dr. Arbie Cookey to determine disposition.   Dr. Arbie Cookey assessed patient and given limb-threatening nature of his occlusion, plan is take him to the operating room for exploration and likely bypass procedure using saphenous vein.    Final Clinical Impression(s) / ED Diagnoses Final diagnoses:  Brachial artery occlusion, right [facility])    Rx / DC Orders ED Discharge Orders    None       Elvera Maria 01/11/20 2008    Mancel Bale, MD 01/11/20 2330

## 2020-01-11 NOTE — ED Provider Notes (Signed)
  Face-to-face evaluation   History: Patient here for evaluation of injury to right upper arm.  He describes injury occurring at 11:30 AM as being pinned between a large branch and a tree trunk, as he was trimming a branch.  Initially the pain and swelling were worse, but now have improved with placement of ice.  I saw the patient at 4:10 PM.  Physical exam: Alert, calm, cooperative, somewhat uncomfortable.  Right upper arm tender and swollen primarily medial distal aspect with palpable mass in this region.  There is also superficial abrasion medially on the left distal upper arm.  Intact sensation and capillary refill in the fingers of the right hand.  Unable to palpate radial pulse, by me.  Doppler interrogation indicates monophasic radial pulse as compared to left arm which has a normal triphasic pulse in the radial artery.   5638- Case discussed with Vascular, Dr. Arbie Cookey, he is in OR. Will see patient aftrer OR. Rec. CTA right arm.  8:10 PM-he has been seen by the vascular surgeon, who plans to take him to the OR for repair of suspected brachial artery injury.  I discussed these findings and plan with the patient who was very nervous about the procedure.  I attempted to answer all of his questions as best I could.        Medical screening examination/treatment/procedure(s) were conducted as a shared visit with non-physician practitioner(s) and myself.  I personally evaluated the patient during the encounter    Mancel Bale, MD 01/11/20 2330

## 2020-01-11 NOTE — Anesthesia Preprocedure Evaluation (Addendum)
Anesthesia Evaluation  Patient identified by MRN, date of birth, ID band Patient awake    Reviewed: Allergy & Precautions, NPO status , Patient's Chart, lab work & pertinent test results  Airway Mallampati: II  TM Distance: >3 FB     Dental   Pulmonary Current Smoker,    breath sounds clear to auscultation       Cardiovascular + Peripheral Vascular Disease   Rhythm:Regular Rate:Normal     Neuro/Psych    GI/Hepatic Neg liver ROS,   Endo/Other  negative endocrine ROS  Renal/GU negative Renal ROS     Musculoskeletal   Abdominal   Peds  Hematology   Anesthesia Other Findings   Reproductive/Obstetrics                            Anesthesia Physical Anesthesia Plan  ASA: III  Anesthesia Plan: General   Post-op Pain Management:    Induction: Intravenous  PONV Risk Score and Plan: 2 and Ondansetron and Midazolam  Airway Management Planned: Oral ETT  Additional Equipment:   Intra-op Plan:   Post-operative Plan: Extubation in OR  Informed Consent: I have reviewed the patients History and Physical, chart, labs and discussed the procedure including the risks, benefits and alternatives for the proposed anesthesia with the patient or authorized representative who has indicated his/her understanding and acceptance.     Dental advisory given  Plan Discussed with: Anesthesiologist and CRNA  Anesthesia Plan Comments:        Anesthesia Quick Evaluation

## 2020-01-11 NOTE — Transfer of Care (Signed)
Immediate Anesthesia Transfer of Care Note  Patient: Eddie Brown  Procedure(s) Performed: Vein Harvest of right greater saphenous vein (Right ) Bypass right Brachial Artery  Patient Location: PACU  Anesthesia Type:General  Level of Consciousness: awake, alert  and oriented  Airway & Oxygen Therapy: Patient Spontanous Breathing and Patient connected to nasal cannula oxygen  Post-op Assessment: Report given to RN and Post -op Vital signs reviewed and stable  Post vital signs: Reviewed and stable  Last Vitals:  Vitals Value Taken Time  BP 150/88 01/11/20 2321  Temp 36.4 C 01/11/20 2321  Pulse 66 01/11/20 2321  Resp 12 01/11/20 2321  SpO2 96 % 01/11/20 2321    Last Pain:  Vitals:   01/11/20 1553  TempSrc:   PainSc: 10-Worst pain ever         Complications: No apparent anesthesia complications

## 2020-01-11 NOTE — H&P (Signed)
[facility] [place]  Patient name: Eddie Brown MRN: [id number] DOB: [birth year] Sex: adult    HPI: Diamond Martucci is a 29 y.o. adult seen in the emergency room with crush injury to his right upper arm.  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  This happened approximately 1130 this morning.  He was found to have diminished pulse by the emergency room physician and underwent CT scan for further evaluation.  He reports that he has tingling in his hand and fingers as if it "went to sleep" his past history is otherwise unremarkable with no major medical difficulties.  History reviewed. No pertinent past medical history.  Family History  Problem Relation Age of Onset  . Cancer Father     SOCIAL HISTORY: Social History   Tobacco Use  . Smoking status: Current Some Day Smoker    Packs/day: 0.50    Types: Cigarettes  . Smokeless tobacco: Never Used  Substance Use Topics  . Alcohol use: Yes    Comment: occasionally    No Known Allergies  No current facility-administered medications for this encounter.   Current Outpatient Medications  Medication Sig Dispense Refill  . acetaZOLAMIDE (DIAMOX) 250 MG tablet Take 250 mg by mouth at bedtime.    Marland Kitchen ibuprofen (ADVIL) 600 MG tablet Take 1 tablet (600 mg total) by mouth every 6 (six) hours as needed. 30 tablet 0  . ondansetron (ZOFRAN) 4 MG tablet Take 1 tablet (4 mg total) by mouth every 6 (six) hours. 12 tablet 0  . oxyCODONE-acetaminophen (PERCOCET/ROXICET) 5-325 MG tablet Take 1-2 tablets by mouth every 6 (six) hours as needed for severe pain. 15 tablet 0  . tamsulosin (FLOMAX) 0.4 MG CAPS capsule Take 1 capsule (0.4 mg total) by mouth daily after supper. 7 capsule 0    REVIEW OF SYSTEMS:  [X]  denotes positive finding, [ ]  denotes negative finding Cardiac  Comments:   Chest pain or chest pressure:    Shortness of breath upon exertion:    Short of breath when lying flat:    Irregular heart rhythm:        Vascular    Pain in calf, thigh, or hip brought on by ambulation:    Pain in feet at night that wakes you up from your sleep:     Blood clot in your veins:    Leg swelling:           PHYSICAL EXAM: Vitals:   01/11/20 1309  BP: (!) 142/100  Pulse: (!) 53  Resp: 17  Temp: 98 F (36.7 C)  TempSrc: Oral  SpO2: 100%  Weight: 77.1 kg  Height: 5\' 8"  (1.727 m)    GENERAL: The patient is a well-nourished adult, in no acute distress. The vital signs are documented above. CARDIOVASCULAR: I do not palpate pulses in his l right arm.  He does have swelling in the right arm from the crush injury. PULMONARY: There is good air exchange  MUSCULOSKELETAL: There are no major deformities or cyanosis. NEUROLOGIC: No focal weakness or paresthesias are detected. SKIN: There are no ulcers or rashes noted. PSYCHIATRIC: The patient has a normal affect.  DATA:  CT angiogram reveals occlusion of the brachial artery above the elbow with reconstitution at the elbow and two-vessel runoff to the hand  MEDICAL ISSUES: Crush injury with occlusion of the right brachial  artery.  I discussed this with the patient explaining the limb threatening nature of this.  He does have motor and sensory to his hand although sensation is diminished.  Covid test is pending.  We will take emergently to the operating room for exploration.  In all likelihood will require saphenous vein harvest for bypass.    Rosetta Posner, MD FACS [facility] Tel [phone] Pager [phone]

## 2020-01-12 ENCOUNTER — Telehealth: Payer: Self-pay

## 2020-01-12 ENCOUNTER — Encounter: Payer: Self-pay | Admitting: *Deleted

## 2020-01-12 DIAGNOSIS — S5701XA Crushing injury of right elbow, initial encounter: Secondary | ICD-10-CM | POA: Diagnosis not present

## 2020-01-12 MED ORDER — POTASSIUM CHLORIDE CRYS ER 20 MEQ PO TBCR: 20.0000 meq | EXTENDED_RELEASE_TABLET | Freq: Every day | ORAL | Status: DC | PRN

## 2020-01-12 MED ORDER — MORPHINE SULFATE (PF) 2 MG/ML IV SOLN: 2.0000 mg | INTRAVENOUS | Status: DC | PRN

## 2020-01-12 MED ORDER — LABETALOL HCL 5 MG/ML IV SOLN: 10.0000 mg | INTRAVENOUS | Status: DC | PRN

## 2020-01-12 MED ORDER — GUAIFENESIN-DM 100-10 MG/5ML PO SYRP: 15.0000 mL | ORAL_SOLUTION | ORAL | Status: DC | PRN

## 2020-01-12 MED ORDER — CEFAZOLIN SODIUM-DEXTROSE 2-4 GM/100ML-% IV SOLN
2.0000 g | Freq: Three times a day (TID) | INTRAVENOUS | Status: DC
  Administered 2020-01-12: 2 g via INTRAVENOUS
  Filled 2020-01-12: qty 100

## 2020-01-12 MED ORDER — METOPROLOL TARTRATE 5 MG/5ML IV SOLN: 2.0000 mg | INTRAVENOUS | Status: DC | PRN

## 2020-01-12 MED ORDER — PANTOPRAZOLE SODIUM 40 MG PO TBEC
40.0000 mg | DELAYED_RELEASE_TABLET | Freq: Every day | ORAL | Status: DC
  Administered 2020-01-12: 40 mg via ORAL
  Filled 2020-01-12: qty 1

## 2020-01-12 MED ORDER — ASPIRIN EC 81 MG PO TBEC: 81.0000 mg | DELAYED_RELEASE_TABLET | Freq: Every day | ORAL | Status: DC

## 2020-01-12 MED ORDER — PHENOL 1.4 % MT LIQD: 1.0000 | OROMUCOSAL | Status: DC | PRN

## 2020-01-12 MED ORDER — DOCUSATE SODIUM 100 MG PO CAPS: 100.0000 mg | ORAL_CAPSULE | Freq: Every day | ORAL | Status: DC

## 2020-01-12 MED ORDER — ACETAZOLAMIDE 250 MG PO TABS: 250.0000 mg | ORAL_TABLET | Freq: Every day | ORAL | Status: DC

## 2020-01-12 MED ORDER — OXYCODONE-ACETAMINOPHEN 5-325 MG PO TABS: 1.0000 | ORAL_TABLET | Freq: Four times a day (QID) | ORAL | 0 refills | Status: DC | PRN

## 2020-01-12 MED ORDER — ASPIRIN 81 MG PO TBEC: 81.0000 mg | DELAYED_RELEASE_TABLET | Freq: Every day | ORAL | Status: AC

## 2020-01-12 MED ORDER — ACETAMINOPHEN 325 MG PO TABS: 325.0000 mg | ORAL_TABLET | ORAL | Status: DC | PRN

## 2020-01-12 MED ORDER — SODIUM CHLORIDE 0.9 % IV SOLN: INTRAVENOUS | Status: DC

## 2020-01-12 MED ORDER — OXYCODONE-ACETAMINOPHEN 5-325 MG PO TABS
1.0000 | ORAL_TABLET | Freq: Four times a day (QID) | ORAL | Status: DC | PRN
  Administered 2020-01-12: 1 via ORAL
  Filled 2020-01-12: qty 1

## 2020-01-12 MED ORDER — MAGNESIUM SULFATE 2 GM/50ML IV SOLN: 2.0000 g | Freq: Every day | INTRAVENOUS | Status: DC | PRN

## 2020-01-12 MED ORDER — TAMSULOSIN HCL 0.4 MG PO CAPS: 0.4000 mg | ORAL_CAPSULE | Freq: Every day | ORAL | Status: DC

## 2020-01-12 MED ORDER — HEPARIN SODIUM (PORCINE) 5000 UNIT/ML IJ SOLN
5000.0000 [IU] | Freq: Three times a day (TID) | INTRAMUSCULAR | Status: DC
  Administered 2020-01-12: 5000 [IU] via SUBCUTANEOUS
  Filled 2020-01-12: qty 1

## 2020-01-12 MED ORDER — ONDANSETRON HCL 4 MG/2ML IJ SOLN: 4.0000 mg | Freq: Four times a day (QID) | INTRAMUSCULAR | Status: DC | PRN

## 2020-01-12 MED ORDER — ACETAMINOPHEN 325 MG RE SUPP: 325.0000 mg | RECTAL | Status: DC | PRN

## 2020-01-12 MED ORDER — ALUM & MAG HYDROXIDE-SIMETH 200-200-20 MG/5ML PO SUSP: 15.0000 mL | ORAL | Status: DC | PRN

## 2020-01-12 MED ORDER — HYDRALAZINE HCL 20 MG/ML IJ SOLN: 5.0000 mg | INTRAMUSCULAR | Status: DC | PRN

## 2020-01-12 MED ORDER — SODIUM CHLORIDE 0.9 % IV SOLN: 500.0000 mL | Freq: Once | INTRAVENOUS | Status: DC | PRN

## 2020-01-12 MED ORDER — ONDANSETRON HCL 4 MG PO TABS: 4.0000 mg | ORAL_TABLET | Freq: Four times a day (QID) | ORAL | Status: DC

## 2020-01-12 NOTE — Progress Notes (Signed)
Discharge instructions given to patient. IV removed, clean and intact. Medications and wound care reviewed. All questions answered. Pt escorted home with wife.  Juliauna Stueve R Anhthu Perdew, RN  

## 2020-01-12 NOTE — Progress Notes (Addendum)
  Progress Note    01/12/2020 7:20 AM 1 Day Post-Op  Subjective:  R arm discomfort.  Wants to go home.   Vitals:   01/12/20 0042 01/12/20 0357  BP: 120/68 134/84  Pulse: 60 64  Resp: 15 17  Temp: 97.7 F (36.5 C) 98.2 F (36.8 C)  SpO2: 96% 95%   Physical Exam: Lungs:  Non labored Incisions:  R arm incision c/d/i; R leg incision c/d/i Extremities:  Palpable R radial pulse Neurologic: A&O  CBC    Component Value Date/Time   WBC 18.6 (H) 01/11/2020 1622   RBC 5.08 01/11/2020 1622   HGB 16.0 01/11/2020 1755   HCT 47.0 01/11/2020 1755   PLT 206 01/11/2020 1622   MCV 93.7 01/11/2020 1622   MCH 31.1 01/11/2020 1622   MCHC 33.2 01/11/2020 1622   RDW 12.7 01/11/2020 1622   LYMPHSABS 0.7 01/11/2020 1622   MONOABS 1.0 01/11/2020 1622   EOSABS 0.0 01/11/2020 1622   BASOSABS 0.1 01/11/2020 1622    BMET    Component Value Date/Time   NA 140 01/11/2020 1755   K 3.7 01/11/2020 1755   CL 107 01/11/2020 1755   CO2 21 (L) 01/11/2020 1722   GLUCOSE 94 01/11/2020 1755   BUN 14 01/11/2020 1755   CREATININE 0.70 01/11/2020 1755   CALCIUM 9.2 01/11/2020 1722   GFRNONAA >60 01/11/2020 1722   GFRAA >60 01/11/2020 1722    INR No results found for: INR   Intake/Output Summary (Last 24 hours) at 01/12/2020 0720 Last data filed at 01/12/2020 0356 Gross per 24 hour  Intake 1550 ml  Output 1075 ml  Net 475 ml     Assessment/Plan:  29 y.o. adult is s/p repair of R arm traumatic brachial artery occlusion 1 Day Post-Op   Perfusing R hand with palpable radial pulse Aspirin for 1 month Ok for discharge home today   Emilie Rutter, [place] [facility] [phone] 01/12/2020 7:20 AM  I have examined the patient, reviewed and agree with above.  Comfortable other than arm soreness.  Palpable radial and ulnar pulse at the wrist.  Upper arm without hematoma.  Plan discharge to home today  Gretta Began, MD 01/12/2020 8:29 AM

## 2020-01-12 NOTE — Care Management CC44 (Signed)
Condition Code 44 Documentation Completed  Patient Details  Name: Tillmon Kisling MRN: [id number] Date of Birth: 1991-03-02   Condition Code 44 given:  Yes Patient signature on Condition Code 44 notice:  Yes Documentation of 2 MD's agreement:  Yes Code 44 added to claim:  Yes    Darrold Span, RN 01/12/2020, 10:44 AM

## 2020-01-12 NOTE — Anesthesia Postprocedure Evaluation (Signed)
Anesthesia Post Note  Patient: Eddie Brown  Procedure(s) Performed: Vein Harvest of right greater saphenous vein (Right ) Bypass right Brachial Artery     Patient location during evaluation: PACU Anesthesia Type: General Level of consciousness: awake Pain management: pain level controlled Vital Signs Assessment: post-procedure vital signs reviewed and stable Respiratory status: spontaneous breathing Cardiovascular status: stable Postop Assessment: no apparent nausea or vomiting Anesthetic complications: no    Last Vitals:  Vitals:   01/12/20 0042 01/12/20 0357  BP: 120/68 134/84  Pulse: 60 64  Resp: 15 17  Temp: 36.5 C 36.8 C  SpO2: 96% 95%    Last Pain:  Vitals:   01/12/20 0357  TempSrc: Oral  PainSc:                  Amaiah Cristiano

## 2020-01-12 NOTE — Telephone Encounter (Signed)
Pts wife called back wanting to know if pt needs to be on an antibiotic, Zofran, aspirin, and Advil? Per Dr. Arbie Cookey, the only thing he needs to take is a baby aspirin once a day x 1 mth.   Amber verbalized understanding.  Ernst Spell., LPN

## 2020-01-12 NOTE — Telephone Encounter (Signed)
Pt's wife Catering manager) called with c/o pt's incision opening up about a 1/2 inch after he was discharged today. Minimal bleeding. I have made Dr. Arbie Cookey aware. Per his advice, I have explained to her there are 3 layers of closure to the incision, so putting a bandaid across the incision to bring the two pieces of tissue back together is acceptable. She verbalized understanding and will call us back if anything changes.

## 2020-01-12 NOTE — Progress Notes (Signed)
MOBILITY TEAM - Progress Note   01/12/20 0803  Mobility  Activity Ambulated in hall;Ambulated to bathroom  Level of Assistance Independent  Assistive Device None  Distance Ambulated (ft) 470 ft  Mobility Response Tolerated well  Mobility performed by Mobility specialist  Bed Position Chair   HR 58-76, post-activity BP 142/87  Patient independent with ambulation, appreciative of walk. Planning for d/c home today. Discussed gentle RLE/RUE ROM and edema control.  Ina Homes, PT, DPT Mobility Team Pager [phone]

## 2020-01-12 NOTE — Care Management Obs Status (Signed)
MEDICARE OBSERVATION STATUS NOTIFICATION   Patient Details  Name: Eddie Brown MRN: [id number] Date of Birth: 1990/12/04   Medicare Observation Status Notification Given:  Yes    Darrold Span, RN 01/12/2020, 10:44 AM

## 2020-01-12 NOTE — Discharge Instructions (Signed)
 [facility] [place]  Discharge instructions  Lower Extremity Bypass Surgery  Please refer to the following instruction for your post-procedure care. Your surgeon or physician assistant will discuss any changes with you.  Activity  You are encouraged to walk as much as you can. You can slowly return to normal activities during the month after your surgery. Avoid strenuous activity and heavy lifting until your doctor tells you it's OK. Avoid activities such as vacuuming or swinging a golf club. Do not drive until your doctor give the OK and you are no longer taking prescription pain medications. It is also normal to have difficulty with sleep habits, eating and bowel movement after surgery. These will go away with time.  Bathing/Showering  You may shower after you go home. Do not soak in a bathtub, hot tub, or swim until the incision heals completely.  Incision Care  Clean your incision with mild soap and water. Shower every day. Pat the area dry with a clean towel. You do not need a bandage unless otherwise instructed. Do not apply any ointments or creams to your incision. If you have open wounds you will be instructed how to care for them or a visiting nurse may be arranged for you. If you have staples or sutures along your incision they will be removed at your post-op appointment. You may have skin glue on your incision. Do not peel it off. It will come off on its own in about one week. If you have a great deal of moisture in your groin, use a gauze help keep this area dry.  Diet  Resume your normal diet. There are no special food restrictions following this procedure. A low fat/ low cholesterol diet is recommended for all patients with vascular disease. In order to heal from your surgery, it is CRITICAL to get adequate nutrition. Your body requires vitamins, minerals, and protein. Vegetables are the best source of vitamins and minerals. Vegetables also provide the  perfect balance of protein. Processed food has little nutritional value, so try to avoid this.  Medications  Resume taking all your medications unless your doctor or nurse practitioner tells you not to. If your incision is causing pain, you may take over-the-counter pain relievers such as acetaminophen (Tylenol). If you were prescribed a stronger pain medication, please aware these medication can cause nausea and constipation. Prevent nausea by taking the medication with a snack or meal. Avoid constipation by drinking plenty of fluids and eating foods with high amount of fiber, such as fruits, vegetables, and grains. Take Colase 100 mg (an over-the-counter stool softener) twice a day as needed for constipation. Do not take Tylenol if you are taking prescription pain medications.  Follow Up  Our office will schedule a follow up appointment 2-3 weeks following discharge.  Please call us immediately for any of the following conditions  Severe or worsening pain in your legs or feet while at rest or while walking Increase pain, redness, warmth, or drainage (pus) from your incision site(s) Fever of 101 degree or higher The swelling in your leg with the bypass suddenly worsens and becomes more painful than when you were in the hospital If you have been instructed to feel your graft pulse then you should do so every day. If you can no longer feel this pulse, call the office immediately. Not all patients are given this instruction.  Leg swelling is common after leg bypass surgery.  The swelling should improve over a few months   following surgery. To improve the swelling, you may elevate your legs above the level of your heart while you are sitting or resting. Your surgeon or physician assistant may ask you to apply an ACE wrap or wear compression (TED) stockings to help to reduce swelling.  Reduce your risk of vascular disease  Stop smoking. If you would like help call [facility] at 1-800-QUIT-NOW  (1-800-784-8669) or [place] at 336-586-4000.  Manage your cholesterol Maintain a desired weight Control your diabetes weight Control your diabetes Keep your blood pressure down  If you have any questions, please call the office at [phone]   

## 2020-01-14 ENCOUNTER — Ambulatory Visit (INDEPENDENT_AMBULATORY_CARE_PROVIDER_SITE_OTHER): Payer: Self-pay | Admitting: Physician Assistant

## 2020-01-14 ENCOUNTER — Other Ambulatory Visit: Payer: Self-pay

## 2020-01-14 VITALS — BP 122/76 | HR 57 | Temp 96.7°F | Resp 16 | Ht 68.0 in | Wt 170.0 lb

## 2020-01-14 DIAGNOSIS — S45101D Unspecified injury of brachial artery, right side, subsequent encounter: Secondary | ICD-10-CM

## 2020-01-14 NOTE — Progress Notes (Signed)
  POST OPERATIVE OFFICE NOTE    CC:  F/u for surgery  HPI:  This is a 29 y.o. male who is s/p Right arm brachial artery exploration and resection of disrupted artery and replacement with reverse great saphenous vein from right leg on 01/11/2020. Patient incurred injury to right upper extremity while trimming trees. Was discharged home on Tuesday in good condition.  He presents today complaining of small amount of serosanguineous drainage.  He denies fever or hand pain  No Known Allergies  Current Outpatient Medications  Medication Sig Dispense Refill  . acetaZOLAMIDE (DIAMOX) 250 MG tablet Take 250 mg by mouth at bedtime.    Marland Kitchen aspirin EC 81 MG EC tablet Take 1 tablet (81 mg total) by mouth daily at 6 (six) AM.    . ibuprofen (ADVIL) 600 MG tablet Take 1 tablet (600 mg total) by mouth every 6 (six) hours as needed. 30 tablet 0  . ondansetron (ZOFRAN) 4 MG tablet Take 1 tablet (4 mg total) by mouth every 6 (six) hours. 12 tablet 0  . oxyCODONE-acetaminophen (PERCOCET/ROXICET) 5-325 MG tablet Take 1 tablet by mouth every 6 (six) hours as needed for severe pain. 30 tablet 0  . tamsulosin (FLOMAX) 0.4 MG CAPS capsule Take 1 capsule (0.4 mg total) by mouth daily after supper. 7 capsule 0   No current facility-administered medications for this visit.     ROS:  See HPI  Physical Exam:  Vitals:   01/14/20 0941  Weight: 170 lb (77.1 kg)  Height: 5\' 8"  (1.727 m)    Incision: Right upper extremity incision with small areas of skin edge separation and serosanguineous drainage.  There is ecchymosis but no erythema, induration or tenderness to palpation.  2+ radial pulse.  Right thigh vein harvest incision well approximated and healing without signs of infection.  Neuro: Alert and oriented x4. General appearance: In no apparent distress.   Assessment/Plan:  This is a 29 y.o. male who is s/p: Repair right brachial artery with right saphenous vein graft secondary to traumatic injury.  No signs  of cellulitis.  Normal upper extremity perfusion.  Incision Steri-Stripped and dry dressing placed.  Recommend changing the dressing as needed.  Keep incisions dry and right arm elevated.   -He has a follow-up appointment already arranged   34, PA-C [facility] [phone]  Clinic MD:  [phone]

## 2020-01-26 ENCOUNTER — Encounter: Payer: Self-pay | Admitting: Vascular Surgery

## 2020-01-26 ENCOUNTER — Ambulatory Visit (INDEPENDENT_AMBULATORY_CARE_PROVIDER_SITE_OTHER): Payer: Medicare Other | Admitting: Vascular Surgery

## 2020-01-26 ENCOUNTER — Other Ambulatory Visit: Payer: Self-pay

## 2020-01-26 VITALS — BP 116/81 | HR 73 | Temp 98.3°F | Resp 20

## 2020-01-26 DIAGNOSIS — S45101D Unspecified injury of brachial artery, right side, subsequent encounter: Secondary | ICD-10-CM

## 2020-01-26 NOTE — Progress Notes (Signed)
   Patient name: Eddie Brown MRN: [id number] DOB: 1991-04-13 Sex: male  REASON FOR VISIT: Follow-up recent right brachial injury  HPI: Eddie Brown is a 29 y.o. male had a crush injury from tree work.  Was pinned between large branch and the tree in his arm.  He had occlusion of his right brachial artery and underwent emergent bypass with vein harvested from his right distal thigh.  He was very emotional today on presentation he became extremely agitated.  Was upset that his wife could not join him for the visit.  He was very difficult to console and was very afraid that he was in a lose his arm.  I brought to his wife back to the exam room with him and he was fine  Current Outpatient Medications  Medication Sig Dispense Refill  . acetaZOLAMIDE (DIAMOX) 250 MG tablet Take 250 mg by mouth at bedtime.    Marland Kitchen aspirin EC 81 MG EC tablet Take 1 tablet (81 mg total) by mouth daily at 6 (six) AM.    . ibuprofen (ADVIL) 600 MG tablet Take 1 tablet (600 mg total) by mouth every 6 (six) hours as needed. 30 tablet 0  . oxyCODONE-acetaminophen (PERCOCET/ROXICET) 5-325 MG tablet Take 1 tablet by mouth every 6 (six) hours as needed for severe pain. 30 tablet 0   No current facility-administered medications for this visit.     PHYSICAL EXAM: Vitals:   01/26/20 0903  BP: 116/81  Pulse: 73  Resp: 20  Temp: 98.3 F (36.8 C)  SpO2: 97%    GENERAL: The patient is a well-nourished male, in no acute distress. The vital signs are documented above. 2-3+ right radial and ulnar pulse.  Right brachial incision is healing without difficulty as is his vein harvest incision of his distal right thigh.  He does have some typical bruising that is dependent in his forearm has some swelling there as well.  MEDICAL ISSUES: Reassured the patient he is making a very nice recovery.  He has completely normal motor and sensory function in his hand fortunately.  He has  normal arterial flow through his bypass.  He will continue to increase his activity slowly and we will see him again in 1 for continued follow-up.  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  I explained that he may not be comfortable doing such heavy work even at a month but should be safe from a surgical standpoint at 1 more month.   Larina Earthly, MD FACS [facility] Tel [phone] Pager [phone]

## 2020-01-26 NOTE — Discharge Summary (Signed)
  Discharge Summary  Patient ID: Eddie Brown [id number] 29 y.o. 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  He was brought emergently to the operating room for interposition grafting of the right brachial artery using right great saphenous vein by Dr. Arbie Cookey on 01/11/2020.  He tolerated the procedure well and was admitted to the hospital post operatively.  POD#1 patient had a palpable radial and ulnar pulse.  He was prescribed 2-3 days of narcotic pain medication.  He will follow up in office to see Dr. Arbie Cookey in about 2 weeks.  He was discharged in stable condition.  Consults: None  Treatments: surgery: interposition graft right brachial artery with right great saphenous vein by Dr. Arbie Cookey 01/11/20  Discharge Exam: See progress note 01/12/20 Vitals:   01/12/20 0357 01/12/20 1101  BP: 134/84 134/76  Pulse: 64 83  Resp: 17 17  Temp: 98.2 F (36.8 C) 98.1 F (36.7 C)  SpO2: 95% 96%     Disposition: Discharge disposition: 01-Home or Self Care       Patient Instructions:  Allergies as of 01/12/2020   No Known Allergies     Medication List    TAKE these medications   acetaZOLAMIDE 250 MG tablet Commonly known as: DIAMOX Take 250 mg by mouth at bedtime.   aspirin 81 MG EC tablet Take 1 tablet (81 mg total) by mouth daily at 6 (six) AM.   ibuprofen 600 MG tablet Commonly known as: ADVIL Take 1 tablet (600 mg total)  by mouth every 6 (six) hours as needed.   oxyCODONE-acetaminophen 5-325 MG tablet Commonly known as: PERCOCET/ROXICET Take 1 tablet by mouth every 6 (six) hours as needed for severe pain. What changed: how much to take      Activity: activity as tolerated Diet: regular diet Wound Care: keep wound clean and dry  Follow-up with Dr. Arbie Cookey in 2 weeks.  Signed: Emilie Rutter, PA-C 01/26/2020 9:25 AM [facility] Office: [phone]

## 2020-03-01 ENCOUNTER — Encounter: Payer: Self-pay | Admitting: Vascular Surgery

## 2020-03-01 ENCOUNTER — Ambulatory Visit (INDEPENDENT_AMBULATORY_CARE_PROVIDER_SITE_OTHER): Payer: Medicare Other | Admitting: Vascular Surgery

## 2020-03-01 ENCOUNTER — Other Ambulatory Visit: Payer: Self-pay

## 2020-03-01 VITALS — BP 118/80 | HR 50 | Temp 98.1°F | Resp 20 | Ht 68.0 in | Wt 180.0 lb

## 2020-03-01 DIAGNOSIS — S45101D Unspecified injury of brachial artery, right side, subsequent encounter: Secondary | ICD-10-CM

## 2020-03-01 NOTE — Progress Notes (Signed)
   Patient name: Eddie Brown MRN: [id number] DOB: 30-Jun-1991 Sex: male  REASON FOR VISIT: Follow-up right brachial artery crush injury  HPI: Bernardo Brayman is a 29 y.o. male here today for follow-up.  Underwent repair of right brachial artery crush injury with replacement of great saphenous vein bypass from his right leg.  He reports occasional shooting sensation through his arm but has had no ischemic symptoms.  Current Outpatient Medications  Medication Sig Dispense Refill  . acetaZOLAMIDE (DIAMOX) 250 MG tablet Take 250 mg by mouth at bedtime.    Marland Kitchen aspirin EC 81 MG EC tablet Take 1 tablet (81 mg total) by mouth daily at 6 (six) AM. (Patient not taking: Reported on 03/01/2020)     No current facility-administered medications for this visit.     PHYSICAL EXAM: Vitals:   03/01/20 0903  BP: 118/80  Pulse: (!) 50  Resp: 20  Temp: 98.1 F (36.7 C)  SpO2: 98%  Weight: 180 lb (81.6 kg)  Height: 5\' 8"  (1.727 m)    GENERAL: The patient is a well-nourished male, in no acute distress. The vital signs are documented above. Completely healed right medial upper arm incision.  2-3+ right radial pulse  MEDICAL ISSUES: Stable overall.  No level of ischemia no neurologic difficulties with routine healing.  Will resume full activities with no limitation.  I discussed signs and symptoms of graft occlusion and he will notify should this occur.  Otherwise we will see him on an as-needed basis   [place], MD [facility] Tel [phone] Pager [phone]

## 2021-04-05 IMAGING — DX DG HUMERUS 2V *R*
2 series · 2 of 2 positions shown · non-contrast
Comparison: None.

CLINICAL DATA: Recent blunt trauma to the right arm with pain,
initial encounter

EXAM:
RIGHT HUMERUS - 2+ VIEW

[humerus lat]
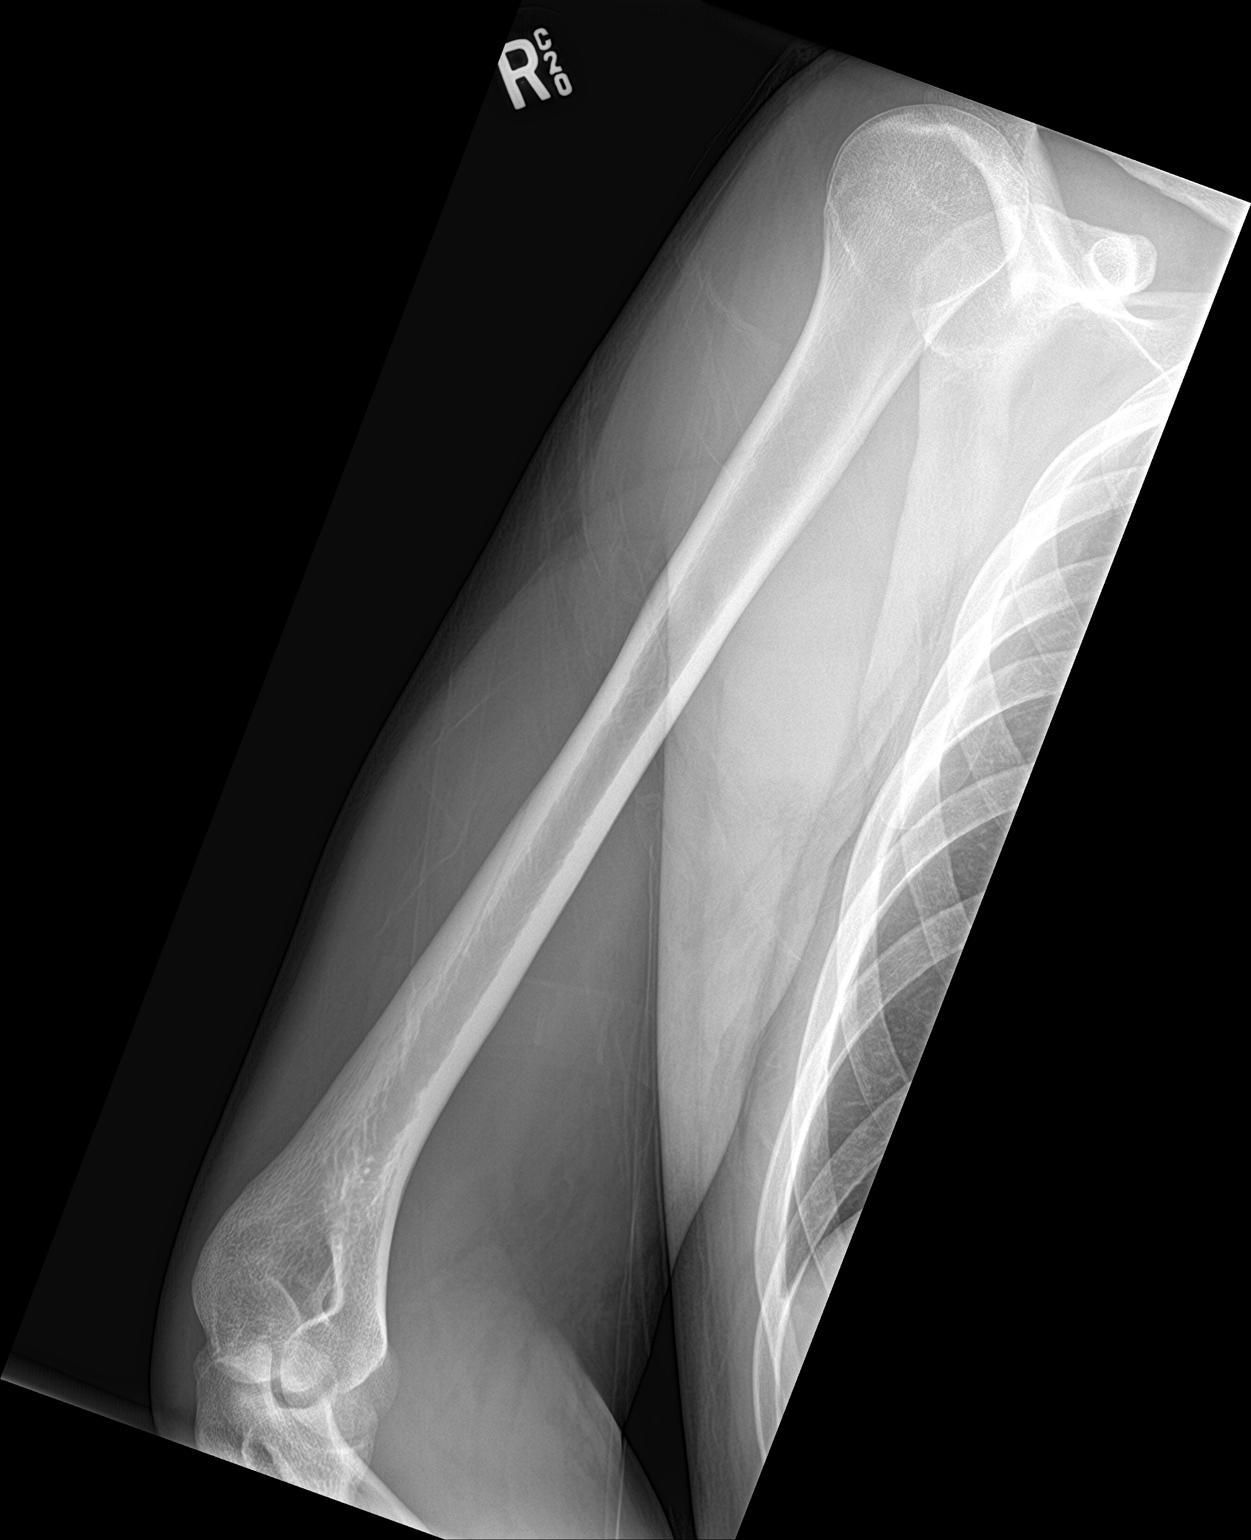

[humerus ap]
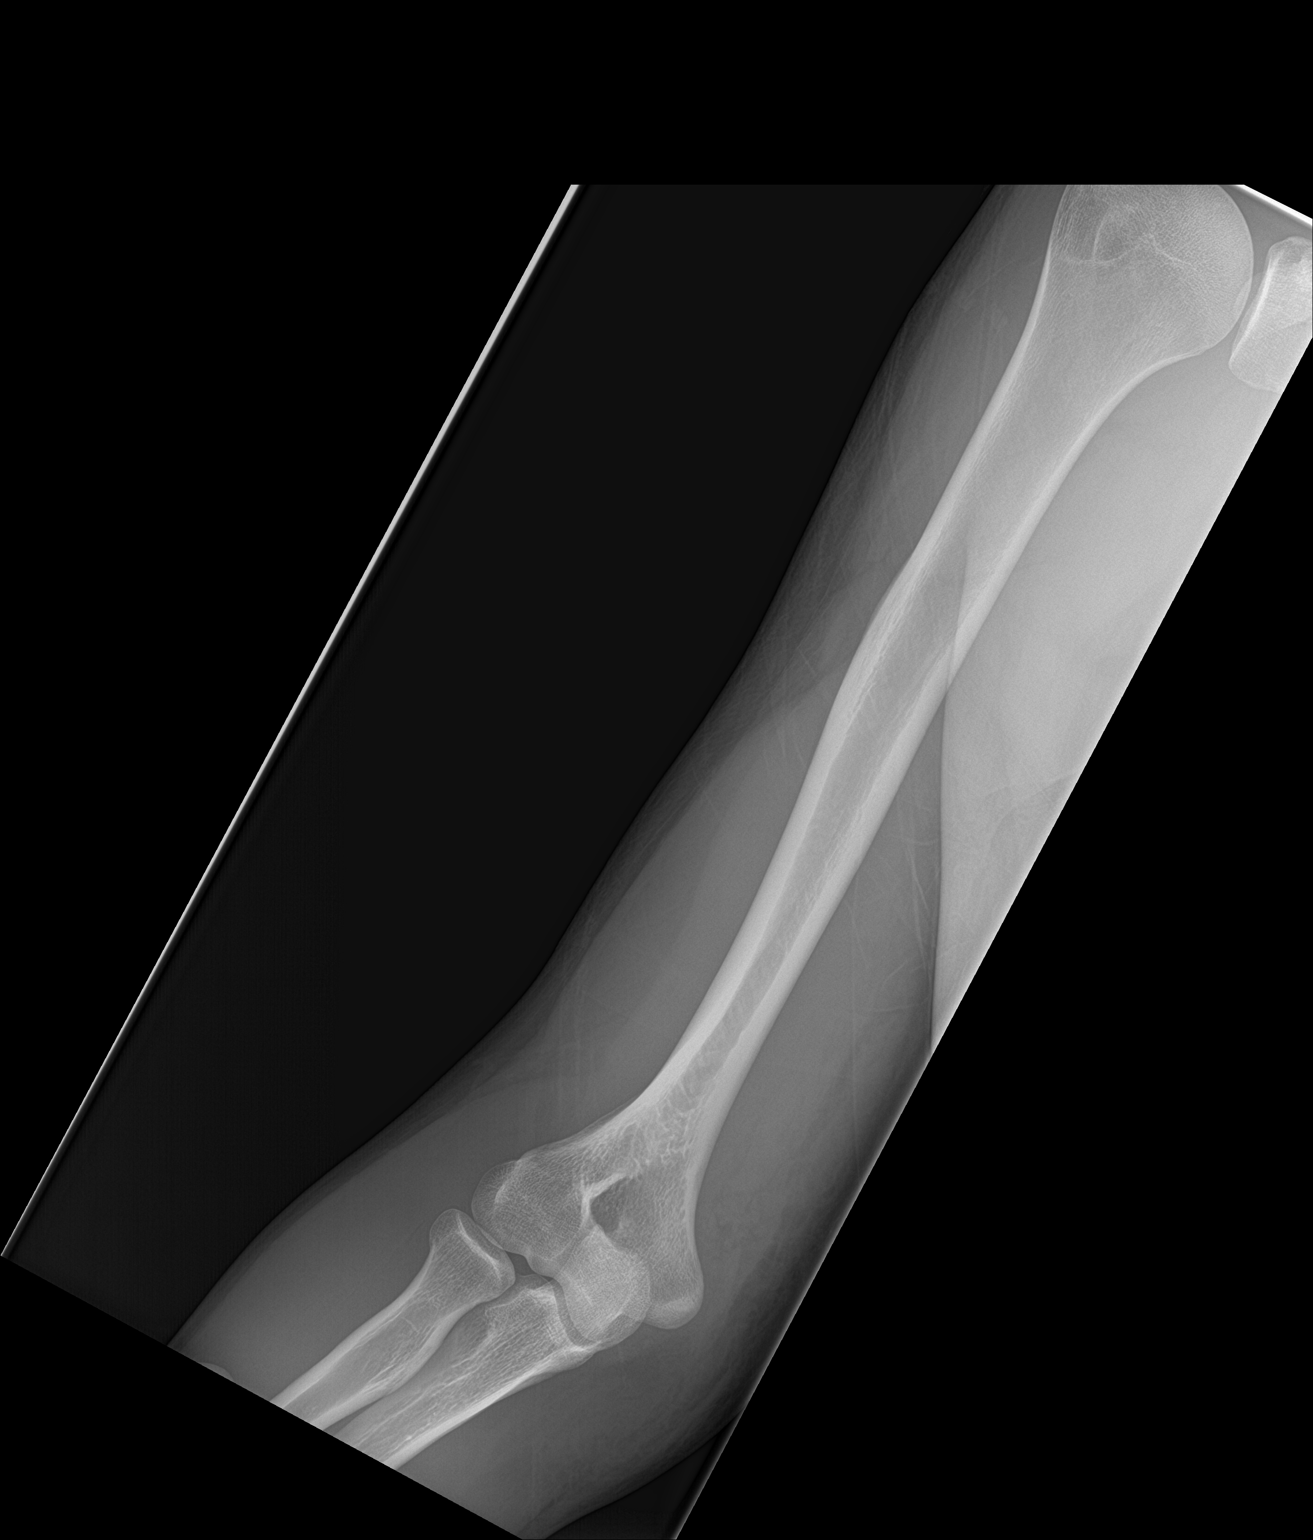

[2 of 2 positions shown; findings below may reference images not displayed]

FINDINGS: Soft tissue swelling is noted distally consistent with the recent
injury. No acute fracture or dislocation is noted. No radiopaque
foreign body is seen.
IMPRESSION: Soft tissue swelling without acute bony abnormality.

## 2022-12-21 NOTE — Progress Notes (Deleted)
New patient visit   Patient: Eddie Brown   DOB: May 14, 1991   32 y.o. Male  MRN: [id number] Visit Date: 12/24/2022  Today's healthcare provider: Gwyneth Sprout, FNP   No chief complaint on file.  Subjective    Eddie Brown is a 32 y.o. male who presents today as a new patient to establish care.  HPI  ***  No past medical history on file. Past Surgical History:  Procedure Laterality Date   BYPASS AXILLA/BRACHIAL ARTERY  01/11/2020   Procedure: Bypass right Brachial Artery;  Surgeon: Rosetta Posner, MD;  Location: [facility] OR;  Service: Vascular;;   VEIN HARVEST Right 01/11/2020   Procedure: Vein Harvest of right greater saphenous vein;  Surgeon: Rosetta Posner, MD;  Location: [facility] OR;  Service: Vascular;  Laterality: Right;   Family Status  Relation Name Status   Mother  Alive   Father  Deceased   Family History  Problem Relation Age of Onset   Cancer Father    Social History   Socioeconomic History   Marital status: Single    Spouse name: Not on file   Number of children: Not on file   Years of education: Not on file   Highest education level: Not on file  Occupational History   Not on file  Tobacco Use   Smoking status: Some Days    Packs/day: 0.50    Types: Cigarettes   Smokeless tobacco: Never  Vaping Use   Vaping Use: Never used  Substance and Sexual Activity   Alcohol use: Yes    Comment: occasionally   Drug use: Yes    Types: Marijuana    Comment: occasionally   Sexual activity: Not on file  Other Topics Concern   Not on file  Social History Narrative   Not on file   Social Determinants of Health   Financial Resource Strain: Not on file  Food Insecurity: Not on file  Transportation Needs: Not on file  Physical Activity: Not on file  Stress: Not on file  Social Connections: Not on file   Outpatient Medications Prior to Visit  Medication Sig   acetaZOLAMIDE (DIAMOX) 250 MG tablet Take 250 mg by mouth at bedtime.   aspirin EC  81 MG EC tablet Take 1 tablet (81 mg total) by mouth daily at 6 (six) AM. (Patient not taking: Reported on 03/01/2020)   No facility-administered medications prior to visit.   No Known Allergies   There is no immunization history on file for this patient.  Health Maintenance  Topic Date Due   Medicare Annual Wellness (AWV)  Never done   COVID-19 Vaccine (1) Never done   HIV Screening  Never done   Hepatitis C Screening  Never done   DTaP/Tdap/Td (1 - Tdap) Never done   INFLUENZA VACCINE  Never done   HPV VACCINES  Aged Out    Patient Care Team: [facility], [facility] as PCP - General (Pediatrics)  Review of Systems  {Labs  Heme  Chem  Endocrine  Serology  Results Review (optional):23779}   Objective    There were no vitals taken for this visit. {Show previous vital signs (optional):23777}  Physical Exam ***  Depression Screen     No data to display         No results found for any visits on 12/24/22.  Assessment & Plan     ***  No follow-ups on file.     {  provider attestation***:1}   Gwyneth Sprout, [place] [phone] (phone) [phone] (fax)  [place]

## 2022-12-24 ENCOUNTER — Ambulatory Visit: Payer: Self-pay | Admitting: Family Medicine

## 2023-06-11 ENCOUNTER — Other Ambulatory Visit: Payer: Self-pay

## 2023-06-11 ENCOUNTER — Emergency Department
Admission: EM | Admit: 2023-06-11 | Discharge: 2023-06-12 | Disposition: A | Discharge status: Home / Self Care | Payer: Medicare Other | Attending: Emergency Medicine | Admitting: Emergency Medicine

## 2023-06-11 DIAGNOSIS — N132 Hydronephrosis with renal and ureteral calculous obstruction: Secondary | ICD-10-CM | POA: Diagnosis not present

## 2023-06-11 DIAGNOSIS — R112 Nausea with vomiting, unspecified: Secondary | ICD-10-CM | POA: Diagnosis present

## 2023-06-11 DIAGNOSIS — N200 Calculus of kidney: Secondary | ICD-10-CM

## 2023-06-11 LAB — URINALYSIS, ROUTINE W REFLEX MICROSCOPIC
Bilirubin Urine: NEGATIVE
Glucose, UA: NEGATIVE mg/dL
Ketones, ur: NEGATIVE mg/dL
Leukocytes,Ua: NEGATIVE
Nitrite: NEGATIVE
Protein, ur: 30 mg/dL — AB
RBC / HPF: >50 RBC/hpf (ref 0–5)
Specific Gravity, Urine: 1.02 (ref 1.005–1.030)
pH: 6 (ref 5.0–8.0)

## 2023-06-11 LAB — COMPREHENSIVE METABOLIC PANEL
ALT: 25 U/L (ref 0–44)
AST: 21 U/L (ref 15–41)
Albumin: 4.1 g/dL (ref 3.5–5.0)
Alkaline Phosphatase: 47 U/L (ref 38–126)
Anion gap: 8 (ref 5–15)
BUN: 13 mg/dL (ref 6–20)
CO2: 21 mmol/L — ABNORMAL LOW (ref 22–32)
Calcium: 9.2 mg/dL (ref 8.9–10.3)
Chloride: 108 mmol/L (ref 98–111)
Creatinine, Ser: 1.12 mg/dL (ref 0.61–1.24)
GFR, Estimated: >60 mL/min (ref >60)
Glucose, Bld: 104 mg/dL — ABNORMAL HIGH (ref 70–99)
Potassium: 3.4 mmol/L — ABNORMAL LOW (ref 3.5–5.1)
Sodium: 137 mmol/L (ref 135–145)
Total Bilirubin: 0.9 mg/dL (ref 0.3–1.2)
Total Protein: 7.1 g/dL (ref 6.5–8.1)

## 2023-06-11 LAB — CBC
HCT: 42.2 % (ref 39.0–52.0)
Hemoglobin: 14.9 g/dL (ref 13.0–17.0)
MCH: 32 pg (ref 26.0–34.0)
MCHC: 35.3 g/dL (ref 30.0–36.0)
MCV: 90.8 fL (ref 80.0–100.0)
Platelets: 261 10*3/uL (ref 150–400)
RBC: 4.65 MIL/uL (ref 4.22–5.81)
RDW: 12.7 % (ref 11.5–15.5)
WBC: 8.8 10*3/uL (ref 4.0–10.5)
nRBC: 0 % (ref 0.0–0.2)

## 2023-06-11 LAB — LIPASE, BLOOD: Lipase: 32 U/L (ref 11–51)

## 2023-06-11 NOTE — ED Triage Notes (Signed)
Pt presents to ER with c/o upper abd pain that started today around 1400.  Pt reports feeling "like I swallowed a sword, and its stuck in the bottom of my stomach.  Pt states pain started in the right side of his mid-back, and radiates to RUQ.  Pt has hx of prior kidney stones, but denies any gall bladder or other abd problems.  Pt is otherwise A&O x4 and in NAD at this time.

## 2023-06-12 ENCOUNTER — Emergency Department: Payer: Medicare Other

## 2023-06-12 DIAGNOSIS — N132 Hydronephrosis with renal and ureteral calculous obstruction: Secondary | ICD-10-CM | POA: Diagnosis not present

## 2023-06-12 MED ORDER — MORPHINE SULFATE (PF) 4 MG/ML IV SOLN
4.0000 mg | Freq: Once | INTRAVENOUS | Status: AC
  Administered 2023-06-12: 4 mg via INTRAVENOUS
  Filled 2023-06-12: qty 1

## 2023-06-12 MED ORDER — OXYCODONE-ACETAMINOPHEN 5-325 MG PO TABS: 2.0000 | ORAL_TABLET | Freq: Four times a day (QID) | ORAL | 0 refills | Status: AC | PRN

## 2023-06-12 MED ORDER — SODIUM CHLORIDE 0.9 % IV BOLUS (SEPSIS)
1000.0000 mL | Freq: Once | INTRAVENOUS | Status: AC
  Administered 2023-06-12: 1000 mL via INTRAVENOUS

## 2023-06-12 MED ORDER — KETOROLAC TROMETHAMINE 30 MG/ML IJ SOLN
30.0000 mg | Freq: Once | INTRAMUSCULAR | Status: AC
  Administered 2023-06-12: 30 mg via INTRAVENOUS
  Filled 2023-06-12: qty 1

## 2023-06-12 MED ORDER — ONDANSETRON HCL 4 MG/2ML IJ SOLN
4.0000 mg | Freq: Once | INTRAMUSCULAR | Status: AC
  Administered 2023-06-12: 4 mg via INTRAVENOUS
  Filled 2023-06-12: qty 2

## 2023-06-12 MED ORDER — TAMSULOSIN HCL 0.4 MG PO CAPS: 0.4000 mg | ORAL_CAPSULE | Freq: Every day | ORAL | 0 refills | Status: AC

## 2023-06-12 MED ORDER — IBUPROFEN 800 MG PO TABS: 800.0000 mg | ORAL_TABLET | Freq: Three times a day (TID) | ORAL | 0 refills | Status: AC | PRN

## 2023-06-12 MED ORDER — ONDANSETRON 4 MG PO TBDP: 4.0000 mg | ORAL_TABLET | Freq: Four times a day (QID) | ORAL | 0 refills | Status: AC | PRN

## 2023-06-12 NOTE — ED Notes (Signed)
Patient discharged at this time. Ambulated to lobby with independent and steady gait. Breathing unlabored speaking in full sentences. Verbalized understanding of all discharge, follow up, and medication teaching. Discharged homed with all belongings.   

## 2023-06-12 NOTE — ED Provider Notes (Signed)
[facility] Provider Note    Event Date/Time   First MD Initiated Contact with Patient 06/11/23 2350     (approximate)   History   Abdominal Pain   HPI  Eddie Brown is a 32 y.o. male with history of previous kidney stones who presents to the emergency department intermittent flank pain for the past week.  No fevers, dysuria.  Has had nausea and vomiting.  No diarrhea.  Feel similar to previous kidney stone.  History provided by patient.    No past medical history on file.  Past Surgical History:  Procedure Laterality Date   BYPASS AXILLA/BRACHIAL ARTERY  01/11/2020   Procedure: Bypass right Brachial Artery;  Surgeon: Larina Earthly, MD;  Location: [facility] OR;  Service: Vascular;;   VEIN HARVEST Right 01/11/2020   Procedure: Vein Harvest of right greater saphenous vein;  Surgeon: Larina Earthly, MD;  Location: [facility] OR;  Service: Vascular;  Laterality: Right;    MEDICATIONS:  Prior to Admission medications   Medication Sig Start Date End Date Taking? Authorizing Provider  acetaZOLAMIDE (DIAMOX) 250 MG tablet Take 250 mg by mouth at bedtime.    [provider]  aspirin EC 81 MG EC tablet Take 1 tablet (81 mg total) by mouth daily at 6 (six) AM. Patient not taking: Reported on 03/01/2020 01/13/20   Emilie Rutter, PA-C    Physical Exam   Triage Vital Signs: ED Triage Vitals [06/11/23 2113]  Encounter Vitals Group     BP (!) 151/104     Systolic BP Percentile      Diastolic BP Percentile      Pulse Rate 61     Resp 16     Temp 98.2 F (36.8 C)     Temp Source Oral     SpO2 100 %     Weight      Height      Head Circumference      Peak Flow      Pain Score      Pain Loc      Pain Education      Exclude from Growth Chart     Most recent vital signs: Vitals:   06/11/23 2113 06/12/23 0143  BP: (!) 151/104 (!) 145/84  Pulse: 61 64  Resp: 16 18  Temp: 98.2 F (36.8 C) 98.1 F (36.7 C)  SpO2: 100% 99%     CONSTITUTIONAL: Alert, responds appropriately to questions. Well-appearing; well-nourished HEAD: Normocephalic, atraumatic EYES: Conjunctivae clear, pupils appear equal, sclera nonicteric ENT: normal nose; moist mucous membranes NECK: Supple, normal ROM CARD: RRR; S1 and S2 appreciated RESP: Normal chest excursion without splinting or tachypnea; breath sounds clear and equal bilaterally; no wheezes, no rhonchi, no rales, no hypoxia or respiratory distress, speaking full sentences ABD/GI: Non-distended; soft, non-tender, no rebound, no guarding, no peritoneal signs BACK: The back appears normal, no CVA tenderness EXT: Normal ROM in all joints; no deformity noted, no edema SKIN: Normal color for age and race; warm; no rash on exposed skin NEURO: Moves all extremities equally, normal speech PSYCH: The patient's mood and manner are appropriate.   ED Results / Procedures / Treatments   LABS: (all labs ordered are listed, but only abnormal results are displayed) Labs Reviewed  COMPREHENSIVE METABOLIC PANEL - Abnormal; Notable for the following components:      Result Value   Potassium 3.4 (*)    CO2 21 (*)    Glucose, Bld 104 (*)  All other components within normal limits  URINALYSIS, ROUTINE W REFLEX MICROSCOPIC - Abnormal; Notable for the following components:   Color, Urine YELLOW (*)    APPearance HAZY (*)    Hgb urine dipstick LARGE (*)    Protein, ur 30 (*)    Bacteria, UA RARE (*)    All other components within normal limits  CBC  LIPASE, BLOOD     EKG:  EKG Interpretation Date/Time:    Ventricular Rate:    PR Interval:    QRS Duration:    QT Interval:    QTC Calculation:   R Axis:      Text Interpretation:           RADIOLOGY: My personal review and interpretation of imaging: CT scan shows right ureteral stone.  I have personally reviewed all radiology reports.   CT Renal Stone Study  Result Date: 06/12/2023 CLINICAL DATA:  Abdominal pain, flank  pain EXAM: CT ABDOMEN AND PELVIS WITHOUT CONTRAST TECHNIQUE: Multidetector CT imaging of the abdomen and pelvis was performed following the standard protocol without IV contrast. RADIATION DOSE REDUCTION: This exam was performed according to the departmental dose-optimization program which includes automated exposure control, adjustment of the mA and/or kV according to patient size and/or use of iterative reconstruction technique. COMPARISON:  09/23/2019 FINDINGS: Lower chest: No acute abnormality Hepatobiliary: No focal hepatic abnormality. Gallbladder unremarkable. Pancreas: No focal abnormality or ductal dilatation. Spleen: No focal abnormality.  Normal size. Adrenals/Urinary Tract: Mild right hydronephrosis due to 5 mm mid right ureteral stone at the pelvic brim. Punctate bilateral nonobstructing renal stones. No hydronephrosis on the left. Adrenal glands and urinary bladder unremarkable. Stomach/Bowel: Normal appendix. Stomach, large and small bowel grossly unremarkable. Vascular/Lymphatic: No evidence of aneurysm or adenopathy. Reproductive: No visible focal abnormality. Other: No free fluid or free air. Musculoskeletal: No acute bony abnormality. IMPRESSION: 5 mm mid right ureteral stone with mild right hydronephrosis. Bilateral nephrolithiasis. Electronically Signed   By: Charlett Nose M.D.   On: 06/12/2023 00:58     PROCEDURES:  Critical Care performed: No   CRITICAL CARE Performed by: Baxter Hire Halena Mohar   Total critical care time: 0 minutes  Critical care time was exclusive of separately billable procedures and treating other patients.  Critical care was necessary to treat or prevent imminent or life-threatening deterioration.  Critical care was time spent personally by me on the following activities: development of treatment plan with patient and/or surrogate as well as nursing, discussions with consultants, evaluation of patient's response to treatment, examination of patient, obtaining  history from patient or surrogate, ordering and performing treatments and interventions, ordering and review of laboratory studies, ordering and review of radiographic studies, pulse oximetry and re-evaluation of patient's condition.   Procedures    IMPRESSION / MDM / ASSESSMENT AND PLAN / ED COURSE  I reviewed the triage vital signs and the nursing notes.    Patient here with right flank pain and vomiting.     DIFFERENTIAL DIAGNOSIS (includes but not limited to):   Kidney stone, pyelonephritis, UTI   Patient's presentation is most consistent with acute complicated illness / injury requiring diagnostic workup.   PLAN: Workup initiated from triage.  Patient does have a large amount of blood in his urine.  Only rare bacteria.  He is not having dysuria and has no leukocytosis or fever.  Normal renal function.  Will obtain CT of abdomen pelvis.  Will give pain and nausea medicine.   MEDICATIONS GIVEN IN ED: Medications  ketorolac (  TORADOL) 30 MG/ML injection 30 mg (30 mg Intravenous Given 06/12/23 0033)  morphine (PF) 4 MG/ML injection 4 mg (4 mg Intravenous Given 06/12/23 0034)  ondansetron (ZOFRAN) injection 4 mg (4 mg Intravenous Given 06/12/23 0033)  sodium chloride 0.9 % bolus 1,000 mL (0 mLs Intravenous Stopped 06/12/23 0143)     ED COURSE: CT scan reviewed and interpreted by myself and the radiologist and shows a 5 mm right renal stone.  Will have him follow-up with urology.  Patient's pain has been well-controlled after Toradol and morphine here.  Will discharge with pain medication, nausea medication and Flomax.   At this time, I do not feel there is any life-threatening condition present. I reviewed all nursing notes, vitals, pertinent previous records.  All lab and urine results, EKGs, imaging ordered have been independently reviewed and interpreted by myself.  I reviewed all available radiology reports from any imaging ordered this visit.  Based on my assessment, I feel the  patient is safe to be discharged home without further emergent workup and can continue workup as an outpatient as needed. Discussed all findings, treatment plan as well as usual and customary return precautions.  They verbalize understanding and are comfortable with this plan.  Outpatient follow-up has been provided as needed.  All questions have been answered.    CONSULTS:  none   OUTSIDE RECORDS REVIEWED: Reviewed recent ophthalmology notes.       FINAL CLINICAL IMPRESSION(S) / ED DIAGNOSES   Final diagnoses:  Kidney stone     Rx / DC Orders   ED Discharge Orders          Ordered    oxyCODONE-acetaminophen (PERCOCET) 5-325 MG tablet  Every 6 hours PRN        06/12/23 0125    ondansetron (ZOFRAN-ODT) 4 MG disintegrating tablet  Every 6 hours PRN        06/12/23 0125    ibuprofen (ADVIL) 800 MG tablet  Every 8 hours PRN        06/12/23 0125    tamsulosin (FLOMAX) 0.4 MG CAPS capsule  Daily        06/12/23 0125             Note:  This document was prepared using Dragon voice recognition software and may include unintentional dictation errors.   Markeisha Mancias, Layla Maw, DO 06/12/23 [phone]

## 2023-06-12 NOTE — Discharge Instructions (Addendum)
